# Patient Record
Sex: Female | Born: 1965 | Race: White | Hispanic: No | Marital: Married | Smoking: Former smoker
Health system: Southern US, Community
[De-identification: ages and names within clinical notes are randomized; demographics above are authoritative.]

## PROBLEM LIST (undated history)

## (undated) DIAGNOSIS — T7840XA Allergy, unspecified, initial encounter: Secondary | ICD-10-CM

## (undated) DIAGNOSIS — F329 Major depressive disorder, single episode, unspecified: Secondary | ICD-10-CM

## (undated) DIAGNOSIS — G43909 Migraine, unspecified, not intractable, without status migrainosus: Secondary | ICD-10-CM

## (undated) DIAGNOSIS — F419 Anxiety disorder, unspecified: Secondary | ICD-10-CM

## (undated) DIAGNOSIS — F32A Depression, unspecified: Secondary | ICD-10-CM

## (undated) HISTORY — DX: Major depressive disorder, single episode, unspecified: F32.9

## (undated) HISTORY — DX: Anxiety disorder, unspecified: F41.9

## (undated) HISTORY — DX: Depression, unspecified: F32.A

## (undated) HISTORY — PX: TUBAL LIGATION: SHX77

## (undated) HISTORY — DX: Migraine, unspecified, not intractable, without status migrainosus: G43.909

## (undated) HISTORY — DX: Allergy, unspecified, initial encounter: T78.40XA

---

## 2006-12-31 ENCOUNTER — Ambulatory Visit: Payer: Self-pay | Admitting: Family Medicine

## 2010-02-10 ENCOUNTER — Ambulatory Visit: Payer: Self-pay | Admitting: Family Medicine

## 2011-03-16 ENCOUNTER — Ambulatory Visit: Payer: Self-pay | Admitting: Family Medicine

## 2011-05-19 ENCOUNTER — Ambulatory Visit: Payer: Self-pay

## 2013-01-21 ENCOUNTER — Ambulatory Visit: Payer: Self-pay

## 2013-01-21 LAB — HM MAMMOGRAPHY

## 2013-10-28 LAB — HM PAP SMEAR

## 2015-01-18 ENCOUNTER — Telehealth: Payer: Self-pay | Admitting: Unknown Physician Specialty

## 2015-01-18 DIAGNOSIS — Z0000 Encounter for general adult medical examination without abnormal findings: Secondary | ICD-10-CM

## 2015-01-18 NOTE — Telephone Encounter (Signed)
Pt called stated she needs an order faxed to the lab at her job as they do her labs for free. Pt has a physical on 01/26/15. Please fax order [phone]. Thanks.

## 2015-01-18 NOTE — Telephone Encounter (Signed)
They are in as future orders

## 2015-01-18 NOTE — Telephone Encounter (Signed)
Routing to provider. Can you out orders in? Then I can print the orders and fax them.

## 2015-01-19 ENCOUNTER — Other Ambulatory Visit: Payer: Self-pay

## 2015-01-19 DIAGNOSIS — Z0000 Encounter for general adult medical examination without abnormal findings: Secondary | ICD-10-CM

## 2015-01-19 NOTE — Telephone Encounter (Signed)
Orders were released and faxed to the number the patient provided.

## 2015-01-25 DIAGNOSIS — F329 Major depressive disorder, single episode, unspecified: Secondary | ICD-10-CM

## 2015-01-25 DIAGNOSIS — G43009 Migraine without aura, not intractable, without status migrainosus: Secondary | ICD-10-CM | POA: Insufficient documentation

## 2015-01-25 DIAGNOSIS — G43909 Migraine, unspecified, not intractable, without status migrainosus: Secondary | ICD-10-CM

## 2015-01-25 DIAGNOSIS — F419 Anxiety disorder, unspecified: Secondary | ICD-10-CM

## 2015-01-26 ENCOUNTER — Telehealth: Payer: Self-pay | Admitting: Gastroenterology

## 2015-01-26 ENCOUNTER — Encounter: Payer: Self-pay | Admitting: Unknown Physician Specialty

## 2015-01-26 ENCOUNTER — Ambulatory Visit (INDEPENDENT_AMBULATORY_CARE_PROVIDER_SITE_OTHER): Payer: PRIVATE HEALTH INSURANCE | Admitting: Unknown Physician Specialty

## 2015-01-26 ENCOUNTER — Other Ambulatory Visit: Payer: Self-pay

## 2015-01-26 VITALS — BP 109/72 | HR 68 | Temp 98.0°F | Ht 63.6 in | Wt 147.8 lb

## 2015-01-26 DIAGNOSIS — Z0000 Encounter for general adult medical examination without abnormal findings: Secondary | ICD-10-CM

## 2015-01-26 NOTE — Progress Notes (Signed)
   BP 109/72 mmHg  Pulse 68  Temp(Src) 98 F (36.7 C)  Ht 5' 3.6" (1.615 m)  Wt 147 lb 12.8 oz (67.042 kg)  BMI 25.70 kg/m2  SpO2 98%  LMP 01/15/2015 (Exact Date)   Subjective:    Patient ID: Judy Blankenship, female    DOB: 12/11/1965, 49 y.o.   MRN: [id number]  HPI: Judy Blankenship is a 49 y.o. female  Chief Complaint  Patient presents with  . Annual Exam    pt's last pap was 10/28/13    Relevant past medical, surgical, family and social history reviewed and updated as indicated. Interim medical history since our last visit reviewed. Allergies and medications reviewed and updated.  Review of Systems  Constitutional: Negative.   HENT: Negative.   Eyes: Negative.   Respiratory: Negative.   Cardiovascular: Negative.   Gastrointestinal: Negative.   Endocrine: Negative.   Genitourinary: Negative.        Intermittent menses.    Musculoskeletal: Negative.   Skin: Negative.   Allergic/Immunologic: Negative.   Neurological: Negative.        Headaches are better  Hematological: Negative.   Psychiatric/Behavioral: Negative.   All other systems reviewed and are negative.   Per HPI unless specifically indicated above     Objective:    BP 109/72 mmHg  Pulse 68  Temp(Src) 98 F (36.7 C)  Ht 5' 3.6" (1.615 m)  Wt 147 lb 12.8 oz (67.042 kg)  BMI 25.70 kg/m2  SpO2 98%  LMP 01/15/2015 (Exact Date)  Wt Readings from Last 3 Encounters:  01/26/15 147 lb 12.8 oz (67.042 kg)  05/05/14 140 lb (63.504 kg)    Physical Exam  Constitutional: She is oriented to person, place, and time. She appears well-developed and well-nourished.  HENT:  Head: Normocephalic and atraumatic.  Eyes: Pupils are equal, round, and reactive to light. Right eye exhibits no discharge. Left eye exhibits no discharge. No scleral icterus.  Neck: Normal range of motion. Neck supple. Carotid bruit is not present. No thyromegaly present.  Cardiovascular: Normal rate, regular rhythm and normal heart sounds.  Exam  reveals no gallop and no friction rub.   No murmur heard. Pulmonary/Chest: Effort normal and breath sounds normal. No respiratory distress. She has no wheezes. She has no rales.  Abdominal: Soft. Bowel sounds are normal. There is no tenderness. There is no rebound.  Genitourinary: No breast swelling, tenderness or discharge.  Musculoskeletal: Normal range of motion.  Lymphadenopathy:    She has no cervical adenopathy.  Neurological: She is alert and oriented to person, place, and time.  Skin: Skin is warm, dry and intact. No rash noted.  Psychiatric: She has a normal mood and affect. Her speech is normal and behavior is normal. Judgment and thought content normal. Cognition and memory are normal.  Nursing note and vitals reviewed.      Assessment & Plan:   Problem List Items Addressed This Visit    None    Visit Diagnoses    Annual physical exam    -  Primary    Relevant Orders    Ambulatory referral to Gastroenterology    MM DIGITAL SCREENING BILATERAL       Awaiting labs she had done with the county Follow up plan: Return if symptoms worsen or fail to improve.

## 2015-01-26 NOTE — Telephone Encounter (Signed)
Gastroenterology Pre-Procedure Review  Request Date: 02-16-2015 Requesting Physician: Dr. Julian Hy  PATIENT REVIEW QUESTIONS: The patient responded to the following health history questions as indicated:    1. Are you having any GI issues? no 2. Do you have a personal history of Polyps? no 3. Do you have a family history of Colon Cancer or Polyps? yes (Polyp Father) 4. Diabetes Mellitus? no 5. Joint replacements in the past 12 months?no 6. Major health problems in the past 3 months?no 7. Any artificial heart valves, MVP, or defibrillator?no    MEDICATIONS & ALLERGIES:    Patient reports the following regarding taking any anticoagulation/antiplatelet therapy:   Plavix, Coumadin, Eliquis, Xarelto, Lovenox, Pradaxa, Brilinta, or Effient? no Aspirin? yes (Daily)  Patient confirms/reports the following medications:  Current Outpatient Prescriptions  Medication Sig Dispense Refill   eletriptan (RELPAX) 40 MG tablet Take 40 mg by mouth as needed for migraine or headache. May repeat in 2 hours if headache persists or recurs.     No current facility-administered medications for this visit.    Patient confirms/reports the following allergies:  NO No Known Allergies  No orders of the defined types were placed in this encounter.    AUTHORIZATION INFORMATION Primary Insurance: 1D#: Group #:  Secondary Insurance: 1D#: Group #:  SCHEDULE INFORMATION: Date: 02-16-2015 Time: Location:[facility]

## 2015-01-26 NOTE — Patient Instructions (Signed)
Think you're too busy to work out? We have the workout for you. In minutes, high-intensity interval training (H.I.I.T.) will have you sweating, breathing hard and maximizing the health benefits of exercise without the time commitment. Best of all, it's scientifically proven to work.  What Is H.I.I.T.? SHORT WORKOUTS 101 High-intensity interval training - referred to as H.I.I.T. - is based on the idea that short bursts of strenuous exercise can have a big impact on the body. If moderate exercise - like a 20-minute jog - is good for your heart, lungs and metabolism, H.I.I.T. packs the benefits of that workout and more into a few minutes. It may sound too good to be true, but learning this exercise technique and adapting it to your life can mean saving hours at the gym. If you think you don't have time to exercise, H.I.I.T. may be the workout for you.  You can try it with any aerobic activity you like. The principles of H.I.I.T. can be applied to running, biking, stair climbing, swimming, jumping rope, rowing, even hopping or skipping. (Yes, skipping!)  The downside? Even though H.I.I.T. lasts only minutes, the workouts are tough, requiring you to push your body near its limit.  HOW INTENSE IS HIGH INTENSITY? High-intensity exercise is obviously not a casual stroll down the street, but it's not a run-till-your-lungs-pop explosion, either. Think breathless, not winded. Heart-pounding, not exploding. Legs pumping, but not uncontrolled.  You don't need any fancy heart rate monitors to do these workouts. Use cues from your body as a guide. In the middle of a high-intensity workout you should be able to say single words, but not complete whole sentences. So, if you can keep chatting to your workout partner during this workout, pump it up a few notches.  03-17-29 Training This simple program will help you make the most of a short workout by improving heart health and endurance. Try it with your favorite  cardiovascular activity. The essentials of 03-17-29 training are simple. Run, ride or perhaps row on a rowing machine gently for 30 seconds, accelerate to a moderate pace for 20 seconds, then sprint as hard as you can for 10 seconds. (It should be called 30-20-10 training, obviously, but that is not as catchy.) Repeat.  You don't even need a stopwatch to monitor the 30-, 20-, and 10-second time changes. You can just count to yourself, which seems to make the intervals pass more quickly.  Best of all? The grueling, all-out portion of the workout lasts for only 10 seconds. C'mon, you can do anything for 10 seconds, right?  Got 10 Minutes? A solitary minute of hard work buried in 10 minutes of activity can make a big difference.  The 10-Minute Workout If you like to run, bike, row or swim - just a little bit - this workout is a great option for you. Step 1 Warm up for 2 minutes Step 2 Pedal, run or swim all-out for 20 seconds. Repeat 2 more times Warm down for 3 minutes    GET STARTED To benefit the most from really, really short workouts, you need to build the habit of doing them into your hectic life. Ideally, you'll complete the workout three times a week. The best way to build that habit is to start small and be willing to tweak your schedule where you can to accommodate your new workout.  First set up a spot in your house for your workout, equipped with whatever you need to get the job done: sneakers, a  chair, a towel, etc. Then slot your workout in before you would normally shower. (You can even do it in the bathroom.) Or wake up five minutes earlier and do it first thing in the morning, so you can head off to work feeling accomplished. Or do it during your lunch hour. Run up your office's stairs or grab a private conference room for just a few minutes. Or work it into your commute. If you walk or bike to work, add some heavy intervals on the way home.  GET A BOOST FROM MUSIC Creating a  workout playlist of high-energy tunes you love will not make your workout feel easier, but it may cause you to exercise harder without even realizing it. Best of all, if you are doing a really short workout, you need only one or two great tunes to get you through. If you are willing to try something a bit different, make your own music as you exercise. Sing, hum, clap your hands, whatever you can do to jam along to your playlist. It may give you an extra boost to finish strong.  Find a song or podcast that's the length of your really, really short workout. By the time the song is over, you're done.  Excerpted from the [employer] Well column http://www.nytimes.com/well/guides/really-really-short-workouts?smid=fb-nytwell&smtyp=pay  

## 2015-01-26 NOTE — Telephone Encounter (Signed)
Orders sent to [facility] for 02-16-15.

## 2015-02-02 ENCOUNTER — Telehealth: Payer: Self-pay | Admitting: Gastroenterology

## 2015-02-02 NOTE — Telephone Encounter (Signed)
No pre certification is required for CPT: [phone] with [employer]--Reference [phone].

## 2015-02-16 ENCOUNTER — Ambulatory Visit: Admit: 2015-02-16 | Payer: Self-pay | Admitting: Gastroenterology

## 2015-02-16 SURGERY — COLONOSCOPY WITH PROPOFOL
Anesthesia: General

## 2015-04-06 ENCOUNTER — Telehealth: Payer: Self-pay | Admitting: Gastroenterology

## 2015-04-06 NOTE — Telephone Encounter (Signed)
Patient left a voice message that she want to cancel her appointment for surgery this month.

## 2015-04-07 NOTE — Telephone Encounter (Signed)
Pt rescheduled to [facility] on 06/22/15. Order has been sent to [facility].

## 2015-06-22 LAB — HM COLONOSCOPY

## 2015-10-05 ENCOUNTER — Encounter: Payer: Self-pay | Admitting: Gastroenterology

## 2015-11-03 ENCOUNTER — Telehealth: Payer: Self-pay

## 2015-11-03 NOTE — Telephone Encounter (Signed)
Patient returned call and patient states she is not taking the medication that pharmacy was requesting. I let the patient know that I would tell the pharmacy this also.

## 2015-11-03 NOTE — Telephone Encounter (Signed)
We got a refill request for a medication that is not listed in patient's current med list. So I tried to call the patient and ask her about it but she did not answer and I left her a voicemail asking for her to please return my call.

## 2015-11-03 NOTE — Telephone Encounter (Signed)
Patient had called and left me a voicemail asking for me to call her at work so I did. She did not answer and I left her a voicemail asking for her to please return my call.

## 2016-07-25 ENCOUNTER — Encounter: Payer: Self-pay | Admitting: Unknown Physician Specialty

## 2016-07-25 ENCOUNTER — Ambulatory Visit (INDEPENDENT_AMBULATORY_CARE_PROVIDER_SITE_OTHER): Payer: Managed Care, Other (non HMO) | Admitting: Unknown Physician Specialty

## 2016-07-25 VITALS — BP 116/75 | HR 81 | Temp 98.4°F | Ht 63.0 in | Wt 149.3 lb

## 2016-07-25 DIAGNOSIS — Z0000 Encounter for general adult medical examination without abnormal findings: Secondary | ICD-10-CM

## 2016-07-25 NOTE — Progress Notes (Signed)
BP 116/75 (BP Location: Left Arm, Patient Position: Sitting, Cuff Size: Normal)   Pulse 81   Temp 98.4 F (36.9 C)   Ht 5\' 3"  (1.6 m)   Wt 149 lb 4.8 oz (67.7 kg)   LMP 06/12/2016 (Exact Date)   SpO2 98%   BMI 26.45 kg/m    Subjective:    Patient ID: Judy Blankenship, female    DOB: July 28, 1965, 51 y.o.   MRN: [id number]  HPI: Judy Blankenship is a 51 y.o. female  Chief Complaint  Patient presents with  . Annual Exam    pt states last mammogram was in 2016, states she would like to have one done this year  . Colonoscopy    pt states last colonoscopy was in 2016 and states that she was cleared for 5 years. Will try to find results for that.    Social History   Social History  . Marital status: Divorced    Spouse name: N/A  . Number of children: N/A  . Years of education: N/A   Occupational History  . Not on file.   Social History Main Topics  . Smoking status: Former Smoker    Quit date: 01/26/2007  . Smokeless tobacco: Never Used  . Alcohol use No  . Drug use: No  . Sexual activity: Yes   Other Topics Concern  . Not on file   Social History Narrative  . No narrative on file   Family History  Problem Relation Age of Onset  . Cancer Mother     ovarian  . Cancer Father     tongue  . Heart disease Maternal Grandfather     MI   Past Medical History:  Diagnosis Date  . Anxiety   . Depression   . Migraine    Past Surgical History:  Procedure Laterality Date  . CESAREAN SECTION    . TUBAL LIGATION       Relevant past medical, surgical, family and social history reviewed and updated as indicated. Interim medical history since our last visit reviewed.   Allergies and medications reviewed and updated.  Review of Systems  Constitutional: Negative.   HENT: Negative.   Eyes: Negative.   Respiratory: Negative.   Cardiovascular: Negative.   Gastrointestinal: Negative.   Endocrine: Negative.   Genitourinary:       Beginning menopausal symptoms and  skipped a period  Musculoskeletal: Negative.   Skin: Negative.   Allergic/Immunologic: Negative.   Neurological: Negative.   Hematological: Negative.   Psychiatric/Behavioral: Negative.     Per HPI unless specifically indicated above     Objective:    BP 116/75 (BP Location: Left Arm, Patient Position: Sitting, Cuff Size: Normal)   Pulse 81   Temp 98.4 F (36.9 C)   Ht 5\' 3"  (1.6 m)   Wt 149 lb 4.8 oz (67.7 kg)   LMP 06/12/2016 (Exact Date)   SpO2 98%   BMI 26.45 kg/m   Wt Readings from Last 3 Encounters:  07/25/16 149 lb 4.8 oz (67.7 kg)  01/26/15 147 lb 12.8 oz (67 kg)  05/05/14 140 lb (63.5 kg)    Physical Exam  Constitutional: She is oriented to person, place, and time. She appears well-developed and well-nourished.  HENT:  Head: Normocephalic and atraumatic.  Eyes: Pupils are equal, round, and reactive to light. Right eye exhibits no discharge. Left eye exhibits no discharge. No scleral icterus.  Neck: Normal range of motion. Neck supple. Carotid bruit is not present. No  thyromegaly present.  Cardiovascular: Normal rate, regular rhythm and normal heart sounds.  Exam reveals no gallop and no friction rub.   No murmur heard. Pulmonary/Chest: Effort normal and breath sounds normal. No respiratory distress. She has no wheezes. She has no rales.  Abdominal: Soft. Bowel sounds are normal. There is no tenderness. There is no rebound.  Genitourinary: No breast swelling, tenderness or discharge.  Musculoskeletal: Normal range of motion.  Lymphadenopathy:    She has no cervical adenopathy.  Neurological: She is alert and oriented to person, place, and time.  Skin: Skin is warm, dry and intact. No rash noted.  Psychiatric: She has a normal mood and affect. Her speech is normal and behavior is normal. Judgment and thought content normal. Cognition and memory are normal.       Assessment & Plan:   Problem List Items Addressed This Visit    None    Visit Diagnoses     Routine general medical examination at a health care facility    -  Primary   Relevant Orders   MM DIGITAL SCREENING BILATERAL   CBC with Differential/Platelet   Comprehensive metabolic panel   Lipid Panel w/o Chol/HDL Ratio   TSH       Follow up plan: Return in about 1 year (around 07/25/2017).

## 2016-07-25 NOTE — Patient Instructions (Signed)
Please do call to schedule your mammogram; the number to schedule one at either [facility] or [facility] is [phone]   

## 2016-07-26 ENCOUNTER — Encounter: Payer: Self-pay | Admitting: Unknown Physician Specialty

## 2016-07-26 LAB — LIPID PANEL W/O CHOL/HDL RATIO
CHOLESTEROL TOTAL: 218 mg/dL — AB (ref 100–199)
HDL: 49 mg/dL (ref >39)
LDL CALC: 147 mg/dL — AB (ref 0–99)
TRIGLYCERIDES: 111 mg/dL (ref 0–149)
VLDL CHOLESTEROL CAL: 22 mg/dL (ref 5–40)

## 2016-07-26 LAB — COMPREHENSIVE METABOLIC PANEL
ALK PHOS: 51 IU/L (ref 39–117)
ALT: 21 IU/L (ref 0–32)
AST: 19 IU/L (ref 0–40)
Albumin/Globulin Ratio: 1.8 (ref 1.2–2.2)
Albumin: 4.4 g/dL (ref 3.5–5.5)
BUN/Creatinine Ratio: 17 (ref 9–23)
BUN: 12 mg/dL (ref 6–24)
Bilirubin Total: 0.4 mg/dL (ref 0.0–1.2)
CO2: 29 mmol/L (ref 18–29)
CREATININE: 0.69 mg/dL (ref 0.57–1.00)
Calcium: 9.6 mg/dL (ref 8.7–10.2)
Chloride: 100 mmol/L (ref 96–106)
GFR calc Af Amer: 117 mL/min/{1.73_m2} (ref >59)
GFR calc non Af Amer: 101 mL/min/{1.73_m2} (ref >59)
GLUCOSE: 92 mg/dL (ref 65–99)
Globulin, Total: 2.5 g/dL (ref 1.5–4.5)
Potassium: 4.8 mmol/L (ref 3.5–5.2)
Sodium: 141 mmol/L (ref 134–144)
Total Protein: 6.9 g/dL (ref 6.0–8.5)

## 2016-07-26 LAB — CBC WITH DIFFERENTIAL/PLATELET
BASOS ABS: 0 10*3/uL (ref 0.0–0.2)
Basos: 0 %
EOS (ABSOLUTE): 0.2 10*3/uL (ref 0.0–0.4)
Eos: 3 %
HEMOGLOBIN: 12.5 g/dL (ref 11.1–15.9)
Hematocrit: 37.8 % (ref 34.0–46.6)
IMMATURE GRANULOCYTES: 0 %
Immature Grans (Abs): 0 10*3/uL (ref 0.0–0.1)
LYMPHS ABS: 2.3 10*3/uL (ref 0.7–3.1)
Lymphs: 31 %
MCH: 29.9 pg (ref 26.6–33.0)
MCHC: 33.1 g/dL (ref 31.5–35.7)
MCV: 90 fL (ref 79–97)
MONOCYTES: 11 %
MONOS ABS: 0.8 10*3/uL (ref 0.1–0.9)
NEUTROS PCT: 55 %
Neutrophils Absolute: 4 10*3/uL (ref 1.4–7.0)
Platelets: 293 10*3/uL (ref 150–379)
RBC: 4.18 x10E6/uL (ref 3.77–5.28)
RDW: 12.9 % (ref 12.3–15.4)
WBC: 7.4 10*3/uL (ref 3.4–10.8)

## 2016-07-26 LAB — TSH: TSH: 1.42 u[IU]/mL (ref 0.450–4.500)

## 2016-07-28 ENCOUNTER — Encounter: Payer: Self-pay | Admitting: Radiology

## 2016-07-28 ENCOUNTER — Ambulatory Visit
Admission: RE | Admit: 2016-07-28 | Discharge: 2016-07-28 | Disposition: A | Discharge status: Home / Self Care | Payer: Managed Care, Other (non HMO) | Source: Ambulatory Visit | Attending: Unknown Physician Specialty | Admitting: Unknown Physician Specialty

## 2016-07-28 DIAGNOSIS — Z1231 Encounter for screening mammogram for malignant neoplasm of breast: Secondary | ICD-10-CM | POA: Diagnosis present

## 2017-03-12 ENCOUNTER — Ambulatory Visit: Payer: Self-pay | Admitting: Physician Assistant

## 2017-03-12 ENCOUNTER — Encounter: Payer: Self-pay | Admitting: Physician Assistant

## 2017-03-12 VITALS — BP 119/80 | HR 84 | Temp 98.5°F | Resp 16

## 2017-03-12 DIAGNOSIS — J069 Acute upper respiratory infection, unspecified: Secondary | ICD-10-CM

## 2017-03-12 MED ORDER — FLUTICASONE PROPIONATE 50 MCG/ACT NA SUSP: 2.0000 | Freq: Every day | NASAL | 6 refills | Status: DC

## 2017-03-12 MED ORDER — AZITHROMYCIN 250 MG PO TABS: ORAL_TABLET | ORAL | 0 refills | Status: DC

## 2017-03-12 NOTE — Progress Notes (Signed)
S: C/o runny nose and congestion for 7 days, no fever, chills, cp/sob, v/d; mucus is clear throughout the day, has a lot of sinus pressure;  cough is sporadic, back feels tight like she is wheezing, her mother and grandchild are both sick, was with them all of last week  Using otc meds: sudafed  O: PE: vitals wnl, nad, perrl eomi, normocephalic, tms dull, nasal mucosa red and swollen, throat injected, neck supple no lymph, lungs c t a, cv rrr, neuro intact  A:  Acute uri   P: drink fluids, continue regular meds , use otc meds of choice, return if not improving in 5 days, return earlier if worsening , zpack, flonase

## 2017-06-22 ENCOUNTER — Encounter: Payer: Self-pay | Admitting: Family Medicine

## 2017-06-22 ENCOUNTER — Ambulatory Visit (INDEPENDENT_AMBULATORY_CARE_PROVIDER_SITE_OTHER): Payer: Managed Care, Other (non HMO) | Admitting: Family Medicine

## 2017-06-22 VITALS — BP 138/88 | HR 90 | Temp 97.7°F | Ht 63.05 in | Wt 149.0 lb

## 2017-06-22 DIAGNOSIS — N930 Postcoital and contact bleeding: Secondary | ICD-10-CM | POA: Diagnosis not present

## 2017-06-22 DIAGNOSIS — R635 Abnormal weight gain: Secondary | ICD-10-CM

## 2017-06-22 DIAGNOSIS — G43009 Migraine without aura, not intractable, without status migrainosus: Secondary | ICD-10-CM

## 2017-06-22 DIAGNOSIS — R102 Pelvic and perineal pain: Secondary | ICD-10-CM

## 2017-06-22 DIAGNOSIS — N926 Irregular menstruation, unspecified: Secondary | ICD-10-CM | POA: Diagnosis not present

## 2017-06-22 MED ORDER — ELETRIPTAN HYDROBROMIDE 40 MG PO TABS: 40.0000 mg | ORAL_TABLET | ORAL | 5 refills | Status: DC | PRN

## 2017-06-22 NOTE — Progress Notes (Signed)
BP 138/88 (BP Location: Left Arm, Patient Position: Sitting, Cuff Size: Normal)   Pulse 90   Temp 97.7 F (36.5 C) (Oral)   Ht 5' 3.05" (1.601 m)   Wt 149 lb (67.6 kg)   LMP 06/08/2017   SpO2 98%   BMI 26.35 kg/m    Subjective:    Patient ID: Judy Blankenship, female    DOB: 09/05/1965, 52 y.o.   MRN: [id number]  HPI: Judy Blankenship is a 52 y.o. female  Chief Complaint  Patient presents with  . Establish Care  . Menopause    Change in mood and cycles, menstrusal migraines getting worse     HPI Patient is here to establish care Started to have some menopausal symptoms Migraines have increased dramatically in the last 4-5 months Used to have 3 day periods, but now 7 days and gushing Aches and anxiety Menstrual migraines had actually decreased over the last few years, but in the last two months, had to use 4-5 relpax She is having terrible bloating Periods have worsened, stabbing pain in her pelvis Some breast tenderness, like getting ready to start her period Had a full period Jan 11th for a week, then bad migraine on the 16th, then finished and then started bleeding again Missing work Having post-coital bleeding Weight gain over last 10 years Bowels are constipated Skin is dry Hx been anemic; low iron Other triggers for migraines; smells can affect her during migraine, as well as light and sound No aura Mother used to have migraines years ago Otherwise, she is healthy, not a medicine person, not sickly  Depression screen [facility] 2/9 06/22/2017 07/25/2016 01/26/2015  Decreased Interest 0 0 0  Down, Depressed, Hopeless 0 0 0  PHQ - 2 Score 0 0 0   Relevant past medical, surgical, family and social history reviewed Past Medical History:  Diagnosis Date  . Anxiety   . Depression   . Migraine    Past Surgical History:  Procedure Laterality Date  . CESAREAN SECTION    . TUBAL LIGATION     Family History  Problem Relation Age of Onset  . Cancer Mother        ovarian    . Cancer Father        tongue  . Heart disease Father        massive heart attack  . Heart disease Maternal Grandfather        MI  . Aneurysm Paternal Grandmother        brain  . Breast cancer Maternal Aunt    Social History   Tobacco Use  . Smoking status: Former Smoker    Last attempt to quit: 01/26/2007    Years since quitting: 10.4  . Smokeless tobacco: Never Used  Substance Use Topics  . Alcohol use: No    Alcohol/week: 0.0 oz  . Drug use: No   Interim medical history since last visit reviewed. Allergies and medications reviewed  Review of Systems Per HPI unless specifically indicated above     Objective:    BP 138/88 (BP Location: Left Arm, Patient Position: Sitting, Cuff Size: Normal)   Pulse 90   Temp 97.7 F (36.5 C) (Oral)   Ht 5' 3.05" (1.601 m)   Wt 149 lb (67.6 kg)   LMP 06/08/2017   SpO2 98%   BMI 26.35 kg/m   Wt Readings from Last 3 Encounters:  06/22/17 149 lb (67.6 kg)  07/25/16 149 lb 4.8 oz (67.7 kg)  01/26/15 147 lb 12.8 oz (67 kg)    Physical Exam  Constitutional: She appears well-developed and well-nourished.  HENT:  Mouth/Throat: Mucous membranes are normal.  Eyes: EOM are normal. No scleral icterus.  Cardiovascular: Normal rate and regular rhythm.  Pulmonary/Chest: Effort normal and breath sounds normal.  Abdominal: Soft. Normal appearance and bowel sounds are normal. She exhibits no distension. There is no tenderness.  Genitourinary: There is no rash on the right labia. There is no rash on the left labia. Uterus is not enlarged and not tender. Cervix exhibits no motion tenderness, no discharge and no friability. Right adnexum displays no mass, no tenderness and no fullness. Left adnexum displays no mass, no tenderness and no fullness. No erythema or bleeding in the vagina. No vaginal discharge found.  Psychiatric: She has a normal mood and affect. Her behavior is normal.    Results for orders placed or performed in visit on 07/25/16   HM COLONOSCOPY  Result Value Ref Range   HM Colonoscopy See Report (in chart) See Report (in chart), Patient Reported      Assessment & Plan:   Problem List Items Addressed This Visit      Cardiovascular and Mediastinum   Migraine without aura    Trial of magnesium for prevention; other preventive meds in future if needed; relpax refills      Relevant Medications   eletriptan (RELPAX) 40 MG tablet    Other Visit Diagnoses    Irregular menstrual cycle    -  Primary   will check thyroid, CBC, LH, FSH, pelvic US; consider referral to GYN   Relevant Orders   US Transvaginal Non-OB   US Pelvis Complete   TSH   T4, free   LH   FSH   Weight gain       check thyroid function, which may also throw off menstrual cycle   Relevant Orders   TSH   T4, free   Postcoital bleeding       concerning; will get pelvic US and refer to GYN for EMB after results   Relevant Orders   US Transvaginal Non-OB   US Pelvis Complete   CBC with Differential/Platelet   Pelvic pain       labs and pelvic US; to GYN if not etiology   Relevant Orders   US Transvaginal Non-OB   US Pelvis Complete       Follow up plan: Return for complete physical when due.  An after-visit summary was printed and given to the patient at [facility].  Please see the patient instructions which may contain other information and recommendations beyond what is mentioned above in the assessment and plan.  Meds ordered this encounter  Medications  . eletriptan (RELPAX) 40 MG tablet    Sig: Take 1 tablet (40 mg total) by mouth as needed. At onset of migraine; may take 1 additional pill 2 hours later if needed; max 2 per 24 hours    Dispense:  10 tablet    Refill:  5    Orders Placed This Encounter  Procedures  . US Transvaginal Non-OB  . US Pelvis Complete  . CBC with Differential/Platelet  . TSH  . T4, free  . LH  . [place]

## 2017-06-22 NOTE — Assessment & Plan Note (Signed)
Trial of magnesium for prevention; other preventive meds in future if needed; relpax refills

## 2017-06-22 NOTE — Patient Instructions (Signed)
We'll get labs today We'll schedule an ultrasound If you have not heard anything from my staff in a week about any orders/referrals/studies from today, please contact us here to follow-up [phone] Consider magnesium oxide 250 mg daily

## 2017-06-23 LAB — CBC WITH DIFFERENTIAL/PLATELET
Basophils Absolute: 57 cells/uL (ref 0–200)
Basophils Relative: 0.7 %
EOS PCT: 2.2 %
Eosinophils Absolute: 178 cells/uL (ref 15–500)
HEMATOCRIT: 38.9 % (ref 35.0–45.0)
Hemoglobin: 13.3 g/dL (ref 11.7–15.5)
LYMPHS ABS: 2884 {cells}/uL (ref 850–3900)
MCH: 30.6 pg (ref 27.0–33.0)
MCHC: 34.2 g/dL (ref 32.0–36.0)
MCV: 89.4 fL (ref 80.0–100.0)
MPV: 10.2 fL (ref 7.5–12.5)
Monocytes Relative: 7.1 %
NEUTROS PCT: 54.4 %
Neutro Abs: 4406 cells/uL (ref 1500–7800)
PLATELETS: 354 10*3/uL (ref 140–400)
RBC: 4.35 10*6/uL (ref 3.80–5.10)
RDW: 11.6 % (ref 11.0–15.0)
Total Lymphocyte: 35.6 %
WBC mixed population: 575 cells/uL (ref 200–950)
WBC: 8.1 10*3/uL (ref 3.8–10.8)

## 2017-06-23 LAB — FOLLICLE STIMULATING HORMONE: FSH: 51.3 m[IU]/mL

## 2017-06-23 LAB — TSH: TSH: 1.05 mIU/L

## 2017-06-23 LAB — LUTEINIZING HORMONE: LH: 34.9 m[IU]/mL

## 2017-06-23 LAB — T4, FREE: Free T4: 1.4 ng/dL (ref 0.8–1.8)

## 2017-06-26 ENCOUNTER — Telehealth: Payer: Self-pay

## 2017-06-26 DIAGNOSIS — N924 Excessive bleeding in the premenopausal period: Secondary | ICD-10-CM

## 2017-06-26 NOTE — Telephone Encounter (Signed)
Called pt informed her of the information below, pt gave verbal understanding. Referral placed.

## 2017-06-26 NOTE — Telephone Encounter (Signed)
-----   Message from Arnetha Courser, MD sent at 06/25/2017  2:02 PM EST ----- Please let pt know that she is not anemic; she does appear to be heading into menopause; we'll see what the ultrasound shows; I'm going to suggest referral to a GYN to help [place] manage her bleeding and symptoms; if she agrees, please enter referral to GYN of choice; thank you

## 2017-06-28 ENCOUNTER — Telehealth: Payer: Self-pay | Admitting: Family Medicine

## 2017-06-28 ENCOUNTER — Ambulatory Visit
Admission: RE | Admit: 2017-06-28 | Discharge: 2017-06-28 | Disposition: A | Discharge status: Home / Self Care | Payer: Managed Care, Other (non HMO) | Source: Ambulatory Visit | Attending: Family Medicine | Admitting: Family Medicine

## 2017-06-28 DIAGNOSIS — N930 Postcoital and contact bleeding: Secondary | ICD-10-CM | POA: Insufficient documentation

## 2017-06-28 DIAGNOSIS — R102 Pelvic and perineal pain: Secondary | ICD-10-CM | POA: Diagnosis present

## 2017-06-28 DIAGNOSIS — N926 Irregular menstruation, unspecified: Secondary | ICD-10-CM | POA: Insufficient documentation

## 2017-06-28 DIAGNOSIS — D259 Leiomyoma of uterus, unspecified: Secondary | ICD-10-CM | POA: Diagnosis not present

## 2017-06-28 NOTE — Telephone Encounter (Signed)
Copied from [place] [id number]. Topic: Inquiry >> Jun 28, 2017 10:51 AM [name] I, NT wrote: Pt Called and wanted Doctor Lada Nurse to call her,Thanks

## 2017-06-28 NOTE — Telephone Encounter (Signed)
Referral has been sent to the requested provider.

## 2017-06-28 NOTE — Telephone Encounter (Signed)
Copied from [place]. Topic: Referral - Question >> Jun 28, 2017 10:58 AM Vernona Rieger wrote: Patient wants her obgyn referral to go to [place] at [facility] [phone] ( her appt is feb 27th )

## 2017-07-02 ENCOUNTER — Telehealth: Payer: Self-pay

## 2017-07-02 NOTE — Telephone Encounter (Signed)
Called pt no answer. LM for pt informing her of the information below. Advised pt to call back for questions or concerns. Also mailed pt education from [employer] regarding fibroids.

## 2017-07-02 NOTE — Telephone Encounter (Signed)
-----   Message from Arnetha Courser, MD sent at 07/02/2017  9:45 AM EST ----- Please let the patient know that her ultrasound showed two fibroids; the largest is on the left side, about 1.5 inches across, and the other is on the right about half of an inch in size; the good news is that there is no abnormal endometrial thickness; we'll have her see the gynecologist about the fibroids and her other symptoms; thank you

## 2017-07-06 ENCOUNTER — Ambulatory Visit: Payer: Self-pay | Admitting: Physician Assistant

## 2017-07-06 ENCOUNTER — Encounter: Payer: Self-pay | Admitting: Physician Assistant

## 2017-07-06 VITALS — BP 110/70 | HR 82 | Temp 97.7°F | Resp 16

## 2017-07-06 DIAGNOSIS — J0120 Acute ethmoidal sinusitis, unspecified: Secondary | ICD-10-CM

## 2017-07-06 MED ORDER — AMOXICILLIN 875 MG PO TABS: 875.0000 mg | ORAL_TABLET | Freq: Two times a day (BID) | ORAL | 0 refills | Status: DC

## 2017-07-06 MED ORDER — FLUCONAZOLE 150 MG PO TABS: 150.0000 mg | ORAL_TABLET | Freq: Once | ORAL | 0 refills | Status: AC

## 2017-07-06 MED ORDER — BENZONATATE 200 MG PO CAPS
200.0000 mg | ORAL_CAPSULE | Freq: Two times a day (BID) | ORAL | 0 refills | Status: DC | PRN
Start: 2017-07-06 — End: 2017-11-16

## 2017-07-06 MED ORDER — FEXOFENADINE-PSEUDOEPHED ER 60-120 MG PO TB12: 1.0000 | ORAL_TABLET | Freq: Two times a day (BID) | ORAL | 0 refills | Status: DC

## 2017-07-06 NOTE — Progress Notes (Signed)
   Subjective: Sinus congestion    Patient ID: Judy Blankenship, female    DOB: 12-04-1965, 52 y.o.   MRN: [id number]  HPI Patient presents with 1 week of increasing sinus pressure and thick rhinorrhea.  Patient also complained of cough secondary to postnasal drainage.  Patient has a frontal headache.  Patient stated nausea with no vomiting.  Patient denies diarrhea.  Patient also state he has a sore throat secondary to postnasal drainage.  Patient states no relief over-the-counter Sudafed.   Review of Systems Unremarkable except for complaint    Objective:   Physical Exam HEENT remarkable for bilateral maxillary guarding.  Edematous nasal turbinate thick rhinorrhea.  Mucous drainage tract post posterior pharynx.  Neck is supple for adenopathy.  Lungs are clear to auscultation heart regular rate and rhythm.       Assessment & Plan: Sinusitis.  Patient given discharge care instruction and prescription for amoxicillin, Allegra-D, Tessalon Perles, and Diflucan for yeast infection secondary to taking antibiotics.  Patient advised to follow-up PCP if no improvement in 1 week.

## 2017-07-27 ENCOUNTER — Encounter: Payer: Managed Care, Other (non HMO) | Admitting: Unknown Physician Specialty

## 2017-09-10 ENCOUNTER — Ambulatory Visit: Payer: Managed Care, Other (non HMO) | Admitting: Family Medicine

## 2017-09-10 ENCOUNTER — Encounter: Payer: Managed Care, Other (non HMO) | Admitting: Family Medicine

## 2017-10-24 ENCOUNTER — Other Ambulatory Visit: Payer: Self-pay | Admitting: Family Medicine

## 2017-10-24 DIAGNOSIS — Z1231 Encounter for screening mammogram for malignant neoplasm of breast: Secondary | ICD-10-CM

## 2017-11-16 ENCOUNTER — Encounter: Payer: Self-pay | Admitting: Family Medicine

## 2017-11-16 ENCOUNTER — Ambulatory Visit
Admission: RE | Admit: 2017-11-16 | Discharge: 2017-11-16 | Disposition: A | Discharge status: Home / Self Care | Payer: Managed Care, Other (non HMO) | Source: Ambulatory Visit | Attending: Family Medicine | Admitting: Family Medicine

## 2017-11-16 ENCOUNTER — Ambulatory Visit (INDEPENDENT_AMBULATORY_CARE_PROVIDER_SITE_OTHER): Payer: Managed Care, Other (non HMO) | Admitting: Family Medicine

## 2017-11-16 VITALS — BP 106/62 | HR 90 | Temp 98.1°F | Resp 12 | Ht 63.0 in | Wt 153.2 lb

## 2017-11-16 DIAGNOSIS — Z1231 Encounter for screening mammogram for malignant neoplasm of breast: Secondary | ICD-10-CM | POA: Diagnosis not present

## 2017-11-16 DIAGNOSIS — Z0000 Encounter for general adult medical examination without abnormal findings: Secondary | ICD-10-CM

## 2017-11-16 NOTE — Patient Instructions (Addendum)
Consider getting the new shingles vaccine called Shingrix; that is available for individuals 53 years of age and older, and is recommended even if you have had shingles in the past and/or already received the old shingles vaccine (Zostavax); it is a two-part series, and is available at many local pharmacies  Health Maintenance, Female Adopting a healthy lifestyle and getting preventive care can go a long way to promote health and wellness. Talk with your health care provider about what schedule of regular examinations is right for you. This is a good chance for you to check in with your provider about disease prevention and staying healthy. In between checkups, there are plenty of things you can do on your own. Experts have done a lot of research about which lifestyle changes and preventive measures are most likely to keep you healthy. Ask your health care provider for more information. Weight and diet Eat a healthy diet  Be sure to include plenty of vegetables, fruits, low-fat dairy products, and lean protein.  Do not eat a lot of foods high in solid fats, added sugars, or salt.  Get regular exercise. This is one of the most important things you can do for your health. ? Most adults should exercise for at least 150 minutes each week. The exercise should increase your heart rate and make you sweat (moderate-intensity exercise). ? Most adults should also do strengthening exercises at least twice a week. This is in addition to the moderate-intensity exercise.  Maintain a healthy weight  Body mass index (BMI) is a measurement that can be used to identify possible weight problems. It estimates body fat based on height and weight. Your health care provider can help determine your BMI and help you achieve or maintain a healthy weight.  For females 76 years of age and older: ? A BMI below 18.5 is considered underweight. ? A BMI of 18.5 to 24.9 is normal. ? A BMI of 25 to 29.9 is considered  overweight. ? A BMI of 30 and above is considered obese.  Watch levels of cholesterol and blood lipids  You should start having your blood tested for lipids and cholesterol at 53 years of age, then have this test every 5 years.  You may need to have your cholesterol levels checked more often if: ? Your lipid or cholesterol levels are high. ? You are older than 53 years of age. ? You are at high risk for heart disease.  Cancer screening Lung Cancer  Lung cancer screening is recommended for adults 11-67 years old who are at high risk for lung cancer because of a history of smoking.  A yearly low-dose CT scan of the lungs is recommended for people who: ? Currently smoke. ? Have quit within the past 15 years. ? Have at least a 30-pack-year history of smoking. A pack year is smoking an average of one pack of cigarettes a day for 1 year.  Yearly screening should continue until it has been 15 years since you quit.  Yearly screening should stop if you develop a health problem that would prevent you from having lung cancer treatment.  Breast Cancer  Practice breast self-awareness. This means understanding how your breasts normally appear and feel.  It also means doing regular breast self-exams. Let your health care provider know about any changes, no matter how small.  If you are in your 20s or 30s, you should have a clinical breast exam (CBE) by a health care provider every 1-3 years as part  of a regular health exam.  If you are 30 or older, have a CBE every year. Also consider having a breast X-ray (mammogram) every year.  If you have a family history of breast cancer, talk to your health care provider about genetic screening.  If you are at high risk for breast cancer, talk to your health care provider about having an MRI and a mammogram every year.  Breast cancer gene (BRCA) assessment is recommended for women who have family members with BRCA-related cancers. BRCA-related cancers  include: ? Breast. ? Ovarian. ? Tubal. ? Peritoneal cancers.  Results of the assessment will determine the need for genetic counseling and BRCA1 and BRCA2 testing.  Cervical Cancer Your health care provider may recommend that you be screened regularly for cancer of the pelvic organs (ovaries, uterus, and vagina). This screening involves a pelvic examination, including checking for microscopic changes to the surface of your cervix (Pap test). You may be encouraged to have this screening done every 3 years, beginning at age 70.  For women ages 17-65, health care providers may recommend pelvic exams and Pap testing every 3 years, or they may recommend the Pap and pelvic exam, combined with testing for human papilloma virus (HPV), every 5 years. Some types of HPV increase your risk of cervical cancer. Testing for HPV may also be done on women of any age with unclear Pap test results.  Other health care providers may not recommend any screening for nonpregnant women who are considered low risk for pelvic cancer and who do not have symptoms. Ask your health care provider if a screening pelvic exam is right for you.  If you have had past treatment for cervical cancer or a condition that could lead to cancer, you need Pap tests and screening for cancer for at least 20 years after your treatment. If Pap tests have been discontinued, your risk factors (such as having a new sexual partner) need to be reassessed to determine if screening should resume. Some women have medical problems that increase the chance of getting cervical cancer. In these cases, your health care provider may recommend more frequent screening and Pap tests.  Colorectal Cancer  This type of cancer can be detected and often prevented.  Routine colorectal cancer screening usually begins at 53 years of age and continues through 53 years of age.  Your health care provider may recommend screening at an earlier age if you have risk factors  for colon cancer.  Your health care provider may also recommend using home test kits to check for hidden blood in the stool.  A small camera at the end of a tube can be used to examine your colon directly (sigmoidoscopy or colonoscopy). This is done to check for the earliest forms of colorectal cancer.  Routine screening usually begins at age 85.  Direct examination of the colon should be repeated every 5-10 years through 53 years of age. However, you may need to be screened more often if early forms of precancerous polyps or small growths are found.  Skin Cancer  Check your skin from head to toe regularly.  Tell your health care provider about any new moles or changes in moles, especially if there is a change in a mole's shape or color.  Also tell your health care provider if you have a mole that is larger than the size of a pencil eraser.  Always use sunscreen. Apply sunscreen liberally and repeatedly throughout the day.  Protect yourself by wearing long  sleeves, pants, a wide-brimmed hat, and sunglasses whenever you are outside.  Heart disease, diabetes, and high blood pressure  High blood pressure causes heart disease and increases the risk of stroke. High blood pressure is more likely to develop in: ? People who have blood pressure in the high end of the normal range (130-139/85-89 mm Hg). ? People who are overweight or obese. ? People who are African American.  If you are 93-79 years of age, have your blood pressure checked every 3-5 years. If you are 85 years of age or older, have your blood pressure checked every year. You should have your blood pressure measured twice-once when you are at a hospital or clinic, and once when you are not at a hospital or clinic. Record the average of the two measurements. To check your blood pressure when you are not at a hospital or clinic, you can use: ? An automated blood pressure machine at a pharmacy. ? A home blood pressure monitor.  If  you are between 74 years and 26 years old, ask your health care provider if you should take aspirin to prevent strokes.  Have regular diabetes screenings. This involves taking a blood sample to check your fasting blood sugar level. ? If you are at a normal weight and have a low risk for diabetes, have this test once every three years after 53 years of age. ? If you are overweight and have a high risk for diabetes, consider being tested at a younger age or more often. Preventing infection Hepatitis B  If you have a higher risk for hepatitis B, you should be screened for this virus. You are considered at high risk for hepatitis B if: ? You were born in a country where hepatitis B is common. Ask your health care provider which countries are considered high risk. ? Your parents were born in a high-risk country, and you have not been immunized against hepatitis B (hepatitis B vaccine). ? You have HIV or AIDS. ? You use needles to inject street drugs. ? You live with someone who has hepatitis B. ? You have had sex with someone who has hepatitis B. ? You get hemodialysis treatment. ? You take certain medicines for conditions, including cancer, organ transplantation, and autoimmune conditions.  Hepatitis C  Blood testing is recommended for: ? Everyone born from [birth year] through 1966. ? Anyone with known risk factors for hepatitis C.  Sexually transmitted infections (STIs)  You should be screened for sexually transmitted infections (STIs) including gonorrhea and chlamydia if: ? You are sexually active and are younger than 53 years of age. ? You are older than 53 years of age and your health care provider tells you that you are at risk for this type of infection. ? Your sexual activity has changed since you were last screened and you are at an increased risk for chlamydia or gonorrhea. Ask your health care provider if you are at risk.  If you do not have HIV, but are at risk, it may be recommended  that you take a prescription medicine daily to prevent HIV infection. This is called pre-exposure prophylaxis (PrEP). You are considered at risk if: ? You are sexually active and do not regularly use condoms or know the HIV status of your partner(s). ? You take drugs by injection. ? You are sexually active with a partner who has HIV.  Talk with your health care provider about whether you are at high risk of being infected with HIV. If  you choose to begin PrEP, you should first be tested for HIV. You should then be tested every 3 months for as long as you are taking PrEP. Pregnancy  If you are premenopausal and you may become pregnant, ask your health care provider about preconception counseling.  If you may become pregnant, take 400 to 800 micrograms (mcg) of folic acid every day.  If you want to prevent pregnancy, talk to your health care provider about birth control (contraception). Osteoporosis and menopause  Osteoporosis is a disease in which the bones lose minerals and strength with aging. This can result in serious bone fractures. Your risk for osteoporosis can be identified using a bone density scan.  If you are 75 years of age or older, or if you are at risk for osteoporosis and fractures, ask your health care provider if you should be screened.  Ask your health care provider whether you should take a calcium or vitamin D supplement to lower your risk for osteoporosis.  Menopause may have certain physical symptoms and risks.  Hormone replacement therapy may reduce some of these symptoms and risks. Talk to your health care provider about whether hormone replacement therapy is right for you. Follow these instructions at home:  Schedule regular health, dental, and eye exams.  Stay current with your immunizations.  Do not use any tobacco products including cigarettes, chewing tobacco, or electronic cigarettes.  If you are pregnant, do not drink alcohol.  If you are  breastfeeding, limit how much and how often you drink alcohol.  Limit alcohol intake to no more than 1 drink per day for nonpregnant women. One drink equals 12 ounces of beer, 5 ounces of wine, or 1 ounces of hard liquor.  Do not use street drugs.  Do not share needles.  Ask your health care provider for help if you need support or information about quitting drugs.  Tell your health care provider if you often feel depressed.  Tell your health care provider if you have ever been abused or do not feel safe at home. This information is not intended to replace advice given to you by your health care provider. Make sure you discuss any questions you have with your health care provider. Document Released: 11/28/2010 Document Revised: 10/21/2015 Document Reviewed: 02/16/2015 Elsevier Interactive Patient Education  2018 [place] out the information at [employer] entitled "Nutrition for Weight Loss: What You Need to Know about Fad Diets" Try to lose between 1-2 pounds per week by taking in fewer calories and burning off more calories You can succeed by limiting portions, limiting foods dense in calories and fat, becoming more active, and drinking 8 glasses of water a day (64 ounces) Don't skip meals, especially breakfast, as skipping meals may alter your metabolism Do not use over-the-counter weight loss pills or gimmicks that claim rapid weight loss A healthy BMI (or body mass index) is between 18.5 and 24.9 You can calculate your ideal BMI at the [place] website [employer]  If you have not heard back as to whether or not your pap smear is up-to-date in one week, please call us

## 2017-11-16 NOTE — Progress Notes (Signed)
Patient ID: Judy Blankenship, female   DOB: Dec 10, 1965, 52 y.o.   MRN: [id number]   Subjective:   Judy Blankenship is a 52 y.o. female here for a complete physical exam  Interim issues since last visit: saw the GYN; tried OCP and that did NOT work; now on the estrogen patch twice a week and progesterone pills every day; that is working now; this would be the time of the month that she would be having symptoms and she is having NO symptoms  USPSTF grade A and B recommendations Depression:  Depression screen [facility] 2/9 11/16/2017 06/22/2017 07/25/2016 01/26/2015  Decreased Interest 0 0 0 0  Down, Depressed, Hopeless 0 0 0 0  PHQ - 2 Score 0 0 0 0  Altered sleeping 0 - - -  Tired, decreased energy 0 - - -  Change in appetite 0 - - -  Feeling bad or failure about yourself  0 - - -  Trouble concentrating 0 - - -  Moving slowly or fidgety/restless 0 - - -  Suicidal thoughts 0 - - -  PHQ-9 Score 0 - - -  Difficult doing work/chores Not difficult at all - - -   Hypertension: BP Readings from Last 3 Encounters:  11/16/17 106/62  07/06/17 110/70  06/22/17 138/88   Obesity: Wt Readings from Last 3 Encounters:  11/16/17 153 lb 3.2 oz (69.5 kg)  06/22/17 149 lb (67.6 kg)  07/25/16 149 lb 4.8 oz (67.7 kg)   BMI Readings from Last 3 Encounters:  11/16/17 27.14 kg/m  06/22/17 26.35 kg/m  07/25/16 26.45 kg/m    Skin cancer: nothing worrisome Lung cancer:  nonsmoker Breast cancer: mammogram was done this morning Colorectal cancer:  Cervical cancer screening: pap smear done she thinks at GYN, but we will see if truly done or just EMB BRCA gene screening: family hx of breast and/or ovarian cancer and/or metastatic prostate cancer? Mother had uterine cancer and maternal aunts x 2 and grandmother; not obese HIV, hep B, hep C: HIV declined, not in birth cohort for hep C STD testing and prevention (chl/gon/syphilis): not interested Intimate partner violence: no abuse Contraception: tried OCPs; no  worries about pregnancy she says Osteoporosis: never took steroids Fall prevention/vitamin D: discussed; calcium intake discussed, food best, supplement only if needed Immunizations: discussed shingrix Diet: room for improvement for fruits and veggies; not much fatty meats Exercise: nothing regular; usually walks but not as much, feels changes in her body and just exhausted; she'll try to walk more Alcohol: no   [facility] Visit from 11/16/2017 in [facility]  AUDIT-C Score  0     Tobacco use: nonsmoker AAA: n/a Aspirin: will check calculator after today's labs Glucose: today (nonfasting) Glucose  Date Value Ref Range Status  07/25/2016 92 65 - 99 mg/dL Final   Glucose, Bld  Date Value Ref Range Status  11/16/2017 91 65 - 139 mg/dL Final    Comment:    .        Non-fasting reference interval .    Lipids:  Lab Results  Component Value Date   CHOL 214 (H) 11/16/2017   CHOL 218 (H) 07/25/2016   Lab Results  Component Value Date   HDL 47 (L) 11/16/2017   HDL 49 07/25/2016   Lab Results  Component Value Date   LDLCALC 145 (H) 11/16/2017   LDLCALC 147 (H) 07/25/2016   Lab Results  Component Value Date   TRIG 103 11/16/2017   TRIG 111  07/25/2016   Lab Results  Component Value Date   CHOLHDL 4.6 11/16/2017   No results found for: LDLDIRECT   Past Medical History:  Diagnosis Date  . Anxiety   . Depression   . Migraine    Past Surgical History:  Procedure Laterality Date  . CESAREAN SECTION    . TUBAL LIGATION     Family History  Problem Relation Age of Onset  . Cancer Mother        ovarian  . Cancer Father        tongue  . Heart disease Father        massive heart attack  . Heart disease Maternal Grandfather        MI  . Aneurysm Paternal Grandmother        brain  . Breast cancer Maternal Aunt    Social History   Tobacco Use  . Smoking status: Former Smoker    Last attempt to quit: 01/26/2007    Years since quitting: 10.8  .  Smokeless tobacco: Never Used  Substance Use Topics  . Alcohol use: No    Alcohol/week: 0.0 oz  . Drug use: No   Review of Systems  Constitutional: Negative for unexpected weight change.  Eyes: Negative for visual disturbance.  Respiratory: Negative for wheezing.   Cardiovascular: Negative for chest pain.  Gastrointestinal: Negative for blood in stool.  Endocrine: Negative for polydipsia.  Genitourinary: Negative for hematuria.  Skin:       No worrisome moles  Allergic/Immunologic: Negative for food allergies.  Neurological: Negative for tremors.  Hematological: Negative for adenopathy. Does not bruise/bleed easily.  Psychiatric/Behavioral: Negative for dysphoric mood.    Objective:   Vitals:   11/16/17 1442  BP: 106/62  Pulse: 90  Resp: 12  Temp: 98.1 F (36.7 C)  TempSrc: Oral  SpO2: 96%  Weight: 153 lb 3.2 oz (69.5 kg)  Height: '5\' 3"'$  (1.6 m)   Body mass index is 27.14 kg/m. Wt Readings from Last 3 Encounters:  11/16/17 153 lb 3.2 oz (69.5 kg)  06/22/17 149 lb (67.6 kg)  07/25/16 149 lb 4.8 oz (67.7 kg)   Physical Exam  Constitutional: She appears well-developed and well-nourished.  HENT:  Head: Normocephalic and atraumatic.  Right Ear: Hearing, tympanic membrane, external ear and ear canal normal.  Left Ear: Hearing, tympanic membrane, external ear and ear canal normal.  Eyes: Conjunctivae and EOM are normal. Right eye exhibits no hordeolum. Left eye exhibits no hordeolum. No scleral icterus.  Neck: Carotid bruit is not present. No thyromegaly present.  Cardiovascular: Normal rate, regular rhythm, S1 normal, S2 normal and normal heart sounds.  No extrasystoles are present.  Pulmonary/Chest: Effort normal and breath sounds normal. No respiratory distress. Right breast exhibits no inverted nipple, no mass, no nipple discharge, no skin change and no tenderness. Left breast exhibits no inverted nipple, no mass, no nipple discharge, no skin change and no tenderness.  Breasts are symmetrical.  Abdominal: Soft. Normal appearance and bowel sounds are normal. She exhibits no distension, no abdominal bruit, no pulsatile midline mass and no mass. There is no hepatosplenomegaly. There is no tenderness. No hernia.  Musculoskeletal: Normal range of motion. She exhibits no edema.  Lymphadenopathy:       Head (right side): No submandibular adenopathy present.       Head (left side): No submandibular adenopathy present.    She has no cervical adenopathy.    She has no axillary adenopathy.  Neurological: She is alert.  She displays no tremor. No cranial nerve deficit. She exhibits normal muscle tone. Gait normal.  Reflex Scores:      Patellar reflexes are 2+ on the right side and 2+ on the left side. Skin: Skin is warm and dry. No bruising and no ecchymosis noted. No cyanosis. No pallor.  Psychiatric: Her speech is normal and behavior is normal. Thought content normal. Her mood appears not anxious. She does not exhibit a depressed mood.    Assessment/Plan:   Problem List Items Addressed This Visit      Other   Preventative health care - Primary    USPSTF grade A and B recommendations reviewed with patient; age-appropriate recommendations, preventive care, screening tests, etc discussed and encouraged; healthy living encouraged; see AVS for patient education given to patient       Relevant Orders   CBC with Differential/Platelet (Completed)   COMPLETE METABOLIC PANEL WITH GFR (Completed)   Lipid panel (Completed)   TSH (Completed)       No orders of the defined types were placed in this encounter.  Orders Placed This Encounter  Procedures  . CBC with Differential/Platelet  . COMPLETE METABOLIC PANEL WITH GFR  . Lipid panel  . TSH    Follow up plan: Return in about 1 year (around 11/17/2018) for complete physical.  An After Visit Summary was printed and given to the patient.

## 2017-11-17 LAB — COMPLETE METABOLIC PANEL WITH GFR
AG RATIO: 1.7 (calc) (ref 1.0–2.5)
ALT: 17 U/L (ref 6–29)
AST: 15 U/L (ref 10–35)
Albumin: 4.3 g/dL (ref 3.6–5.1)
Alkaline phosphatase (APISO): 58 U/L (ref 33–130)
BUN: 14 mg/dL (ref 7–25)
CALCIUM: 9.5 mg/dL (ref 8.6–10.4)
CO2: 28 mmol/L (ref 20–32)
Chloride: 104 mmol/L (ref 98–110)
Creat: 0.71 mg/dL (ref 0.50–1.05)
GFR, EST AFRICAN AMERICAN: 113 mL/min/{1.73_m2} (ref >=60)
GFR, EST NON AFRICAN AMERICAN: 97 mL/min/{1.73_m2} (ref >=60)
Globulin: 2.6 g/dL (calc) (ref 1.9–3.7)
Glucose, Bld: 91 mg/dL (ref 65–139)
POTASSIUM: 4 mmol/L (ref 3.5–5.3)
Sodium: 138 mmol/L (ref 135–146)
TOTAL PROTEIN: 6.9 g/dL (ref 6.1–8.1)
Total Bilirubin: 0.3 mg/dL (ref 0.2–1.2)

## 2017-11-17 LAB — LIPID PANEL
Cholesterol: 214 mg/dL — ABNORMAL HIGH (ref <200)
HDL: 47 mg/dL — ABNORMAL LOW (ref >50)
LDL Cholesterol (Calc): 145 mg/dL (calc) — ABNORMAL HIGH
NON-HDL CHOLESTEROL (CALC): 167 mg/dL — AB (ref <130)
Total CHOL/HDL Ratio: 4.6 (calc) (ref <5.0)
Triglycerides: 103 mg/dL (ref <150)

## 2017-11-17 LAB — CBC WITH DIFFERENTIAL/PLATELET
BASOS ABS: 41 {cells}/uL (ref 0–200)
Basophils Relative: 0.5 %
Eosinophils Absolute: 353 cells/uL (ref 15–500)
Eosinophils Relative: 4.3 %
HCT: 37.2 % (ref 35.0–45.0)
HEMOGLOBIN: 12.8 g/dL (ref 11.7–15.5)
Lymphs Abs: 2632 cells/uL (ref 850–3900)
MCH: 30.3 pg (ref 27.0–33.0)
MCHC: 34.4 g/dL (ref 32.0–36.0)
MCV: 87.9 fL (ref 80.0–100.0)
MONOS PCT: 8.3 %
MPV: 10.2 fL (ref 7.5–12.5)
NEUTROS ABS: 4494 {cells}/uL (ref 1500–7800)
Neutrophils Relative %: 54.8 %
Platelets: 317 10*3/uL (ref 140–400)
RBC: 4.23 10*6/uL (ref 3.80–5.10)
RDW: 11.7 % (ref 11.0–15.0)
Total Lymphocyte: 32.1 %
WBC mixed population: 681 cells/uL (ref 200–950)
WBC: 8.2 10*3/uL (ref 3.8–10.8)

## 2017-11-17 LAB — TSH: TSH: 1.6 m[IU]/L

## 2017-11-18 ENCOUNTER — Encounter: Payer: Self-pay | Admitting: Family Medicine

## 2017-11-18 DIAGNOSIS — E785 Hyperlipidemia, unspecified: Secondary | ICD-10-CM | POA: Insufficient documentation

## 2017-11-21 DIAGNOSIS — Z0000 Encounter for general adult medical examination without abnormal findings: Secondary | ICD-10-CM | POA: Insufficient documentation

## 2017-11-21 NOTE — Assessment & Plan Note (Signed)
USPSTF grade A and B recommendations reviewed with patient; age-appropriate recommendations, preventive care, screening tests, etc discussed and encouraged; healthy living encouraged; see AVS for patient education given to patient  

## 2018-09-11 ENCOUNTER — Encounter: Payer: Managed Care, Other (non HMO) | Admitting: Family Medicine

## 2018-10-16 ENCOUNTER — Other Ambulatory Visit: Payer: Self-pay | Admitting: Orthopedic Surgery

## 2018-10-16 ENCOUNTER — Other Ambulatory Visit: Payer: Self-pay | Admitting: Family Medicine

## 2018-10-16 DIAGNOSIS — M5412 Radiculopathy, cervical region: Secondary | ICD-10-CM

## 2018-10-16 DIAGNOSIS — Z1231 Encounter for screening mammogram for malignant neoplasm of breast: Secondary | ICD-10-CM

## 2018-10-28 ENCOUNTER — Other Ambulatory Visit: Payer: Self-pay

## 2018-10-28 ENCOUNTER — Ambulatory Visit
Admission: RE | Admit: 2018-10-28 | Discharge: 2018-10-28 | Disposition: A | Discharge status: Home / Self Care | Payer: Managed Care, Other (non HMO) | Source: Ambulatory Visit | Attending: Orthopedic Surgery | Admitting: Orthopedic Surgery

## 2018-10-28 DIAGNOSIS — M5412 Radiculopathy, cervical region: Secondary | ICD-10-CM

## 2018-11-18 ENCOUNTER — Other Ambulatory Visit: Payer: Self-pay

## 2018-11-18 ENCOUNTER — Ambulatory Visit
Admission: RE | Admit: 2018-11-18 | Discharge: 2018-11-18 | Disposition: A | Discharge status: Home / Self Care | Payer: Managed Care, Other (non HMO) | Source: Ambulatory Visit | Attending: Family Medicine | Admitting: Family Medicine

## 2018-11-18 DIAGNOSIS — Z1231 Encounter for screening mammogram for malignant neoplasm of breast: Secondary | ICD-10-CM | POA: Insufficient documentation

## 2018-11-19 ENCOUNTER — Encounter: Payer: Managed Care, Other (non HMO) | Admitting: Family Medicine

## 2018-12-04 ENCOUNTER — Encounter: Payer: Managed Care, Other (non HMO) | Admitting: Family Medicine

## 2018-12-09 NOTE — Progress Notes (Signed)
Name: Judy Blankenship   MRN: [id number]    DOB: April 05, 1966   Date:12/10/2018       Progress Note  Subjective  Chief Complaint  Chief Complaint  Patient presents with  . Annual Exam    HPI   Patient presents for annual CPE.  Diet:  2 meals a day  Usually skips breakfast- if she doesn't has a small fruit  Around lunch has a sandwich , noodles Dinner- protein, vegetable, & carb- ex spaghetti and salad. Sweet tea- a few times a week.  and water (4 bottles)  Exercise:  Had started the gym in February but stopped with the pandemic. Has been lazy and has not exercised in a while.   USPSTF grade A and B recommendations    Office Visit from 12/10/2018 in [facility]  AUDIT-C Score  0     Depression: Phq 9 is  negative Depression screen [facility] 2/9 12/10/2018 11/16/2017 06/22/2017 07/25/2016 01/26/2015  Decreased Interest 0 0 0 0 0  Down, Depressed, Hopeless 0 0 0 0 0  PHQ - 2 Score 0 0 0 0 0  Altered sleeping 0 0 - - -  Tired, decreased energy 0 0 - - -  Change in appetite 0 0 - - -  Feeling bad or failure about yourself  0 0 - - -  Trouble concentrating 0 0 - - -  Moving slowly or fidgety/restless 0 0 - - -  Suicidal thoughts 0 0 - - -  PHQ-9 Score 0 0 - - -  Difficult doing work/chores Not difficult at all Not difficult at all - - -   Hypertension: BP Readings from Last 3 Encounters:  12/10/18 122/74  11/16/17 106/62  07/06/17 110/70   Obesity: Wt Readings from Last 3 Encounters:  12/10/18 152 lb (68.9 kg)  11/16/17 153 lb 3.2 oz (69.5 kg)  06/22/17 149 lb (67.6 kg)   BMI Readings from Last 3 Encounters:  12/10/18 26.93 kg/m  11/16/17 27.14 kg/m  06/22/17 26.35 kg/m    Hep C Screening: will complete  STD testing and prevention (HIV/chl/gon/syphilis): decline  Intimate partner violence: denies  Sexual History/Pain during Intercourse: denies  Menstrual History/LMP/Abnormal Bleeding: LMP- 11/20/2018- no abnormal bleeding. Started on hormonal  therapies due to emotional stress and heavy periods.   Advanced Care Planning: A voluntary discussion about advance care planning including the explanation and discussion of advance directives.  Discussed health care proxy and Living will, and the patient was able to identify a health care proxy as husband robert Nydam.  Patient does not have a living will at present time. If patient does have living will, I have requested they bring this to the clinic to be scanned in to their chart.  Breast cancer: mammogram completed last month- and clear.  HM Mammogram  Date Value Ref Range Status  01/21/2013 from [facility]  Final   Cervical cancer screening: recommended annually due to hormonal therapies.   Lipids:  Lab Results  Component Value Date   CHOL 214 (H) 11/16/2017   CHOL 218 (H) 07/25/2016   Lab Results  Component Value Date   HDL 47 (L) 11/16/2017   HDL 49 07/25/2016   Lab Results  Component Value Date   LDLCALC 145 (H) 11/16/2017   LDLCALC 147 (H) 07/25/2016   Lab Results  Component Value Date   TRIG 103 11/16/2017   TRIG 111 07/25/2016   Lab Results  Component Value Date   CHOLHDL 4.6 11/16/2017  No results found for: LDLDIRECT  Glucose:  Glucose  Date Value Ref Range Status  07/25/2016 92 65 - 99 mg/dL Final   Glucose, Bld  Date Value Ref Range Status  11/16/2017 91 65 - 139 mg/dL Final    Comment:    .        Non-fasting reference interval .     Skin cancer: discussed  Colorectal cancer: due 2022   Lung cancer:  Low Dose CT Chest recommended if Age 95-80 years, 30 pack-year currently smoking OR have quit w/in 15years. Patient does not qualify.     Patient Active Problem List   Diagnosis Date Noted  . Preventative health care 11/21/2017  . Hyperlipidemia LDL goal <130 11/18/2017  . Migraine without aura 01/25/2015  . Anxiety and depression 01/25/2015    Past Surgical History:  Procedure Laterality Date  . CESAREAN SECTION    . TUBAL LIGATION       Family History  Problem Relation Age of Onset  . Cancer Mother        ovarian  . Cancer Father        tongue  . Heart disease Father        massive heart attack  . Heart disease Maternal Grandfather        MI  . Aneurysm Paternal Grandmother        brain  . Breast cancer Maternal Aunt     Social History   Socioeconomic History  . Marital status: Married    Spouse name: Herbie [place]  . Number of children: 2  . Years of education: 59  . Highest education level: Associate degree: academic program  Occupational History  . Not on file  Social Needs  . Financial resource strain: Not hard at all  . Food insecurity    Worry: Never true    Inability: Never true  . Transportation needs    Medical: No    Non-medical: No  Tobacco Use  . Smoking status: Former Smoker    Quit date: 01/26/2007    Years since quitting: 11.8  . Smokeless tobacco: Never Used  Substance and Sexual Activity  . Alcohol use: No    Alcohol/week: 0.0 standard drinks  . Drug use: No  . Sexual activity: Yes    Birth control/protection: None  Lifestyle  . Physical activity    Days per week: 0 days    Minutes per session: 0 min  . Stress: Not at all  Relationships  . Social connections    Talks on phone: More than three times a week    Gets together: More than three times a week    Attends religious service: More than 4 times per year    Active member of club or organization: No    Attends meetings of clubs or organizations: Never    Relationship status: Married  . Intimate partner violence    Fear of current or ex partner: No    Emotionally abused: No    Physically abused: No    Forced sexual activity: No  Other Topics Concern  . Not on file  Social History Narrative  . Not on file     Current Outpatient Medications:  .  estradiol (VIVELLE-DOT) 0.075 MG/24HR, Place 1 patch onto the skin 2 (two) times a week., Disp: , Rfl: 3 .  progesterone (PROMETRIUM) 100 MG capsule, Take 1 capsule by  mouth daily., Disp: , Rfl: 3  No Known Allergies   Review of Systems  Constitutional: Negative for chills, fever and malaise/fatigue.  HENT: Negative for congestion, sinus pain and sore throat.   Eyes: Negative for blurred vision.  Respiratory: Negative for cough and shortness of breath.   Cardiovascular: Negative for chest pain, palpitations and leg swelling.  Gastrointestinal: Negative for abdominal pain, blood in stool, constipation, diarrhea and nausea.  Genitourinary: Negative for dysuria.  Musculoskeletal: Negative for falls and joint pain.  Skin: Negative for rash.  Neurological: Negative for dizziness, tingling and headaches.  Endo/Heme/Allergies: Negative for polydipsia.  Psychiatric/Behavioral: The patient has insomnia (uncomfortable bed- getting a new one). The patient is not nervous/anxious.      Objective  Vitals:   12/10/18 0849  BP: 122/74  Pulse: 69  Resp: 14  Temp: (!) 97.3 F (36.3 C)  TempSrc: Temporal  SpO2: 98%  Weight: 152 lb (68.9 kg)  Height: 5\' 3"  (1.6 m)    Body mass index is 26.93 kg/m.  Physical Exam Vitals signs reviewed.  Constitutional:      Appearance: She is well-developed.  HENT:     Head: Normocephalic and atraumatic.  Neck:     Musculoskeletal: Normal range of motion and neck supple.     Vascular: No carotid bruit.  Cardiovascular:     Heart sounds: Normal heart sounds.  Pulmonary:     Effort: Pulmonary effort is normal.     Breath sounds: Normal breath sounds.  Abdominal:     General: Bowel sounds are normal.     Palpations: Abdomen is soft.     Tenderness: There is no abdominal tenderness.  Musculoskeletal: Normal range of motion.  Skin:    General: Skin is warm and dry.     Capillary Refill: Capillary refill takes less than 2 seconds.  Neurological:     Mental Status: She is alert and oriented to person, place, and time.     GCS: GCS eye subscore is 4. GCS verbal subscore is 5. GCS motor subscore is 6.     Sensory:  No sensory deficit.  Psychiatric:        Speech: Speech normal.        Behavior: Behavior normal.        Thought Content: Thought content normal.        Judgment: Judgment normal.       No results found for this or any previous visit (from the past 2160 hour(s)).  Fall Risk: Fall Risk  12/10/2018 06/22/2017 07/25/2016  Falls in the past year? 0 No No  Number falls in past yr: 0 - -  Injury with Fall? 0 - -     Functional Status Survey: Is the patient deaf or have difficulty hearing?: No Does the patient have difficulty seeing, even when wearing glasses/contacts?: No Does the patient have difficulty concentrating, remembering, or making decisions?: No Does the patient have difficulty walking or climbing stairs?: No Does the patient have difficulty dressing or bathing?: No Does the patient have difficulty doing errands alone such as visiting a doctor's office or shopping?: No  Assessment & Plan  1. Preventative health care - COMPLETE METABOLIC PANEL WITH GFR - Lipid Profile  2. Encounter for hepatitis C screening test for low risk patient - Hepatitis C Antibody  3. Screening for HIV (human immunodeficiency virus) - HIV antibody (with reflex)  4. Screening for cervical cancer - Cytology - PAP    -USPSTF grade A and B recommendations reviewed with patient; age-appropriate recommendations, preventive care, screening tests, etc discussed and encouraged; healthy living encouraged;  see AVS for patient education given to patient -Discussed importance of 150 minutes of physical activity weekly, eat two servings of fish weekly, eat one serving of tree nuts ( cashews, pistachios, pecans, almonds.Marland Kitchen) every other day, eat 6 servings of fruit/vegetables daily and drink plenty of water and avoid sweet beverages.   -Reviewed Health Maintenance: screenings ordered

## 2018-12-10 ENCOUNTER — Other Ambulatory Visit (HOSPITAL_COMMUNITY)
Admission: RE | Admit: 2018-12-10 | Discharge: 2018-12-10 | Disposition: A | Discharge status: Home / Self Care | Payer: Managed Care, Other (non HMO) | Source: Ambulatory Visit | Attending: Family Medicine | Admitting: Family Medicine

## 2018-12-10 ENCOUNTER — Encounter: Payer: Self-pay | Admitting: Nurse Practitioner

## 2018-12-10 ENCOUNTER — Other Ambulatory Visit: Payer: Self-pay

## 2018-12-10 ENCOUNTER — Ambulatory Visit (INDEPENDENT_AMBULATORY_CARE_PROVIDER_SITE_OTHER): Payer: Managed Care, Other (non HMO) | Admitting: Nurse Practitioner

## 2018-12-10 VITALS — BP 122/74 | HR 69 | Temp 97.3°F | Resp 14 | Ht 63.0 in | Wt 152.0 lb

## 2018-12-10 DIAGNOSIS — Z124 Encounter for screening for malignant neoplasm of cervix: Secondary | ICD-10-CM

## 2018-12-10 DIAGNOSIS — Z0000 Encounter for general adult medical examination without abnormal findings: Secondary | ICD-10-CM | POA: Diagnosis not present

## 2018-12-10 DIAGNOSIS — Z1159 Encounter for screening for other viral diseases: Secondary | ICD-10-CM | POA: Diagnosis not present

## 2018-12-10 DIAGNOSIS — Z114 Encounter for screening for human immunodeficiency virus [HIV]: Secondary | ICD-10-CM

## 2018-12-10 NOTE — Patient Instructions (Signed)
If you have not heard anything from my staff in a week about any orders/referrals/studies from today, please contact us here to follow-up [phone] or mychart.   General recommendations: 150 minutes of physical activity weekly, eat two servings of fish weekly, eat one serving of tree nuts ( cashews, pistachios, pecans, almonds.Marland Kitchen) every other day, eat 6 servings of fruit/vegetables daily and drink plenty of water and avoid sweet beverages. Recommend at least 64 ounces of water daily.   Stay Safe in the [place] The majority of sun exposure occurs before age 76 and skin cancer can take 20 years or more to develop. Whether your sun bathing days are behind you or you still spend time pursuing the perfect tan, you should be concerned about skin cancer.  Remember, the sun's ultraviolet (UV) rays can reflect off water, sand, concrete and snow, and can reach below the water's surface. Certain types of UV light penetrate fog and clouds, so it's possible to get sunburn even on overcast days.  Avoid direct sunlight as much as possible during the peak sun hours, generally 10 a.m. to 3 p.m., or seek shade during this part of day. Wear broad-spectrum sunscreen - with an SPF of at least 30 - containing both UVA and UVB protection. Look for ingredients like [employer] (also known as avobenzone) or titanium dioxide on the label. Reapply sunscreen frequently, at least every two hours when outdoors, especially if you perspire or you've been swimming. Your best bet is to choose water-resistant products that are more likely to stay on your skin. Wear lip balm with an SPF 15 or higher. Wear a hat and other protective clothing while in the sun. Tightly woven fibers and darker clothing generally provide more protection. Also, look for products approved by the American Academy of Dermatology. Wear UV-protective sunglasses.

## 2018-12-11 LAB — COMPLETE METABOLIC PANEL WITH GFR
AG Ratio: 1.5 (calc) (ref 1.0–2.5)
ALT: 14 U/L (ref 6–29)
AST: 15 U/L (ref 10–35)
Albumin: 4.1 g/dL (ref 3.6–5.1)
Alkaline phosphatase (APISO): 51 U/L (ref 37–153)
BUN: 11 mg/dL (ref 7–25)
CO2: 27 mmol/L (ref 20–32)
Calcium: 9.3 mg/dL (ref 8.6–10.4)
Chloride: 104 mmol/L (ref 98–110)
Creat: 0.69 mg/dL (ref 0.50–1.05)
GFR, Est African American: 114 mL/min/{1.73_m2} (ref >=60)
GFR, Est Non African American: 99 mL/min/{1.73_m2} (ref >=60)
Globulin: 2.7 g/dL (calc) (ref 1.9–3.7)
Glucose, Bld: 93 mg/dL (ref 65–99)
Potassium: 4 mmol/L (ref 3.5–5.3)
Sodium: 138 mmol/L (ref 135–146)
Total Bilirubin: 0.4 mg/dL (ref 0.2–1.2)
Total Protein: 6.8 g/dL (ref 6.1–8.1)

## 2018-12-11 LAB — CYTOLOGY - PAP
Adequacy: ABSENT
Diagnosis: NEGATIVE

## 2018-12-11 LAB — HEPATITIS C ANTIBODY
Hepatitis C Ab: NONREACTIVE
SIGNAL TO CUT-OFF: 0.01 (ref <1.00)

## 2018-12-11 LAB — LIPID PANEL
Cholesterol: 190 mg/dL (ref <200)
HDL: 44 mg/dL — ABNORMAL LOW (ref >=50)
LDL Cholesterol (Calc): 124 mg/dL (calc) — ABNORMAL HIGH
Non-HDL Cholesterol (Calc): 146 mg/dL (calc) — ABNORMAL HIGH (ref <130)
Total CHOL/HDL Ratio: 4.3 (calc) (ref <5.0)
Triglycerides: 113 mg/dL (ref <150)

## 2018-12-11 LAB — HIV ANTIBODY (ROUTINE TESTING W REFLEX): HIV 1&2 Ab, 4th Generation: NONREACTIVE

## 2019-08-09 ENCOUNTER — Other Ambulatory Visit: Payer: Self-pay

## 2019-08-09 ENCOUNTER — Emergency Department
Admission: EM | Admit: 2019-08-09 | Discharge: 2019-08-09 | Disposition: A | Discharge status: Home / Self Care | Payer: Managed Care, Other (non HMO) | Attending: Student | Admitting: Student

## 2019-08-09 ENCOUNTER — Emergency Department: Payer: Managed Care, Other (non HMO)

## 2019-08-09 DIAGNOSIS — Y999 Unspecified external cause status: Secondary | ICD-10-CM | POA: Diagnosis not present

## 2019-08-09 DIAGNOSIS — Y929 Unspecified place or not applicable: Secondary | ICD-10-CM | POA: Diagnosis not present

## 2019-08-09 DIAGNOSIS — W293XXA Contact with powered garden and outdoor hand tools and machinery, initial encounter: Secondary | ICD-10-CM | POA: Insufficient documentation

## 2019-08-09 DIAGNOSIS — S61215A Laceration without foreign body of left ring finger without damage to nail, initial encounter: Secondary | ICD-10-CM | POA: Diagnosis not present

## 2019-08-09 DIAGNOSIS — Y93H2 Activity, gardening and landscaping: Secondary | ICD-10-CM | POA: Diagnosis not present

## 2019-08-09 MED ORDER — LIDOCAINE-EPINEPHRINE-TETRACAINE (LET) SOLUTION: 3.0000 mL | Freq: Once | NASAL | Status: DC

## 2019-08-09 MED ORDER — IBUPROFEN 800 MG PO TABS
800.0000 mg | ORAL_TABLET | Freq: Once | ORAL | Status: AC
  Administered 2019-08-09: 800 mg via ORAL
  Filled 2019-08-09: qty 1

## 2019-08-09 MED ORDER — IBUPROFEN 800 MG PO TABS: 800.0000 mg | ORAL_TABLET | Freq: Three times a day (TID) | ORAL | 0 refills | Status: DC | PRN

## 2019-08-09 MED ORDER — SULFAMETHOXAZOLE-TRIMETHOPRIM 800-160 MG PO TABS: 1.0000 | ORAL_TABLET | Freq: Two times a day (BID) | ORAL | 0 refills | Status: DC

## 2019-08-09 MED ORDER — LIDOCAINE HCL (PF) 1 % IJ SOLN
5.0000 mL | Freq: Once | INTRAMUSCULAR | Status: AC
  Administered 2019-08-09: 5 mL
  Filled 2019-08-09: qty 5

## 2019-08-09 MED ORDER — TETANUS-DIPHTH-ACELL PERTUSSIS 5-2.5-18.5 LF-MCG/0.5 IM SUSP
0.5000 mL | Freq: Once | INTRAMUSCULAR | Status: DC
  Filled 2019-08-09: qty 0.5

## 2019-08-09 MED ORDER — SULFAMETHOXAZOLE-TRIMETHOPRIM 800-160 MG PO TABS
1.0000 | ORAL_TABLET | Freq: Once | ORAL | Status: AC
  Administered 2019-08-09: 1 via ORAL
  Filled 2019-08-09: qty 1

## 2019-08-09 NOTE — ED Provider Notes (Signed)
[facility] Emergency Department Provider Note ____________________________________________  Time seen: 1730  I have reviewed the triage vital signs and the nursing notes.  HISTORY  Chief Complaint  Laceration  HPI Judy Blankenship is a 54 y.o. female presents to the ED for evaluation of accidental laceration to the left middle finger.  Patient was using electric hedge tremors when she excellently cut her finger.  She presents now for evaluation management of her finger laceration.  She denies any other injury at this time.  She does report an out of date tetanus status, but admits to receiving her initial Covid vaccine dose today.   Past Medical History:  Diagnosis Date  . Anxiety   . Depression   . Migraine     Patient Active Problem List   Diagnosis Date Noted  . Preventative health care 11/21/2017  . Hyperlipidemia LDL goal <130 11/18/2017  . Migraine without aura 01/25/2015  . Anxiety and depression 01/25/2015    Past Surgical History:  Procedure Laterality Date  . CESAREAN SECTION    . TUBAL LIGATION      Prior to Admission medications   Medication Sig Start Date End Date Taking? Authorizing Provider  estradiol (VIVELLE-DOT) 0.075 MG/24HR Place 1 patch onto the skin 2 (two) times a week. 10/26/17   [provider]  ibuprofen (ADVIL) 800 MG tablet Take 1 tablet (800 mg total) by mouth every 8 (eight) hours as needed. 08/09/19   Monic Engelmann, Dannielle Karvonen, PA-C  progesterone (PROMETRIUM) 100 MG capsule Take 1 capsule by mouth daily. 10/25/17   [provider]  sulfamethoxazole-trimethoprim (BACTRIM DS) 800-160 MG tablet Take 1 tablet by mouth 2 (two) times daily. 08/09/19   Yaretzi Ernandez, Dannielle Karvonen, PA-C    Allergies Patient has no known allergies.  Family History  Problem Relation Age of Onset  . Cancer Mother        ovarian  . Cancer Father        tongue  . Heart disease Father        massive heart attack  . Heart disease  Maternal Grandfather        MI  . Aneurysm Paternal Grandmother        brain  . Breast cancer Maternal Aunt     Social History Social History   Tobacco Use  . Smoking status: Former Smoker    Quit date: 01/26/2007    Years since quitting: 12.5  . Smokeless tobacco: Never Used  Substance Use Topics  . Alcohol use: No    Alcohol/week: 0.0 standard drinks  . Drug use: No    Review of Systems  Constitutional: Negative for fever. Cardiovascular: Negative for chest pain. Respiratory: Negative for shortness of breath. Musculoskeletal: Negative for back pain. Skin: Negative for rash. Left ring finger laceration.  Neurological: Negative for headaches, focal weakness or numbness. ____________________________________________  PHYSICAL EXAM:  VITAL SIGNS: ED Triage Vitals  Enc Vitals Group     BP 08/09/19 1620 130/73     Pulse Rate 08/09/19 1620 76     Resp 08/09/19 1620 18     Temp 08/09/19 1620 98.5 F (36.9 C)     Temp Source 08/09/19 1620 Oral     SpO2 08/09/19 1620 98 %     Weight 08/09/19 1621 150 lb (68 kg)     Height 08/09/19 1621 5\' 3"  (1.6 m)     Head Circumference --      Peak Flow --  Pain Score 08/09/19 1621 9     Pain Loc --      Pain Edu? --      Excl. in [place]? --     Constitutional: Alert and oriented. Well appearing and in no distress. Head: Normocephalic and atraumatic. Eyes: Conjunctivae are normal. Normal extraocular movements Cardiovascular: Normal rate, regular rhythm. Normal distal pulses. Respiratory: Normal respiratory effort. No wheezes/rales/rhonchi. Musculoskeletal: Normal composite fist on the left hand.  The left ring finger with soft tissue disruption to the distal fat pad. nontender with normal range of motion in all extremities.  Neurologic:  Normal gross sensation. Normal speech and language. No gross focal neurologic deficits are appreciated. Skin:  Skin is warm, dry and intact. No rash  noted. ___________________________________________   RADIOLOGY  DG Left Ring Finger   IMPRESSION: Laceration without fracture or foreign body. ___________________________________________  PROCEDURES Bactrim DS 1 PO  .Marland KitchenLaceration Repair  Date/Time: 08/09/2019 6:03 PM Performed by: Melvenia Needles, PA-C Authorized by: Melvenia Needles, PA-C   Consent:    Consent obtained:  Verbal   Consent given by:  Patient   Risks discussed:  Pain and poor wound healing Anesthesia (see MAR for exact dosages):    Anesthesia method:  Nerve block   Block location:  Left ring finger flexor tendon   Block anesthetic:  Lidocaine 1% w/o epi   Block technique:  Transthecal    Block injection procedure:  Anatomic landmarks palpated, introduced needle and incremental injection   Block outcome:  Anesthesia achieved Laceration details:    Location:  Finger   Finger location:  L ring finger Repair type:    Repair type:  Simple Pre-procedure details:    Preparation:  Patient was prepped and draped in usual sterile fashion Treatment:    Area cleansed with:  Betadine Skin repair:    Repair method:  Sutures   Suture size:  4-0   Suture material:  Nylon   Suture technique:  Simple interrupted   Number of sutures:  9 Approximation:    Approximation:  Close Post-procedure details:    Dressing:  Non-adherent dressing and bulky dressing   Patient tolerance of procedure:  Tolerated well, no immediate complications   ____________________________________________  INITIAL IMPRESSION / ASSESSMENT AND PLAN / ED COURSE  Patient with ED evaluation of an accidental laceration to the left ring finger.  She sustained soft tissue injury while using electric hedge tremors.  She presents now for management of her symptoms.  There is no radiologic evidence of fracture, dislocation, or retained foreign body.  Patient soft tissue injury was repaired using sutures.  She is discharged with wound care  instructions and supplies.  She will see her primary provider in 10 to 12 days for suture removal.  She will update her tetanus 4 weeks after her second Covid vaccine.  Judy Blankenship was evaluated in Emergency Department on 08/09/2019 for the symptoms described in the history of present illness. She was evaluated in the context of the global COVID-19 pandemic, which necessitated consideration that the patient might be at risk for infection with the SARS-CoV-2 virus that causes COVID-19. Institutional protocols and algorithms that pertain to the evaluation of patients at risk for COVID-19 are in a state of rapid change based on information released by regulatory bodies including the CDC and federal and state organizations. These policies and algorithms were followed during the patient's care in the ED. ____________________________________________  FINAL CLINICAL IMPRESSION(S) / ED DIAGNOSES  Final diagnoses:  Laceration of left ring finger without foreign body without damage to nail, initial encounter      Melvenia Needles, PA-C 08/09/19 2005    Lilia Pro., MD 08/10/19 1124

## 2019-08-09 NOTE — Discharge Instructions (Addendum)
Keep the wound clean, dry, and covered. Take the antibiotic as directed and the pain medicine as needed. See your provider in 10-14 days for suture removal.

## 2019-08-09 NOTE — ED Notes (Signed)
Pt presents to ED today w/ laceration on ring finger of left hand. Laceration occurred working in the yard cutting with shears.

## 2019-08-09 NOTE — ED Triage Notes (Signed)
Cut distal end of L ring finger on hedge cutters. Has rag around finger to stop bleeding.  No blood thinner use. Unknown on tetanus.

## 2019-09-11 ENCOUNTER — Other Ambulatory Visit: Payer: Self-pay | Admitting: Obstetrics and Gynecology

## 2020-01-06 ENCOUNTER — Other Ambulatory Visit: Payer: Self-pay | Admitting: Obstetrics and Gynecology

## 2020-01-06 DIAGNOSIS — Z1231 Encounter for screening mammogram for malignant neoplasm of breast: Secondary | ICD-10-CM

## 2020-01-13 HISTORY — PX: TRIGGER FINGER RELEASE: SHX641

## 2020-01-28 ENCOUNTER — Ambulatory Visit
Admission: RE | Admit: 2020-01-28 | Discharge: 2020-01-28 | Disposition: A | Discharge status: Home / Self Care | Payer: Managed Care, Other (non HMO) | Source: Ambulatory Visit | Attending: Obstetrics and Gynecology | Admitting: Obstetrics and Gynecology

## 2020-01-28 ENCOUNTER — Other Ambulatory Visit: Payer: Self-pay

## 2020-01-28 DIAGNOSIS — Z1231 Encounter for screening mammogram for malignant neoplasm of breast: Secondary | ICD-10-CM | POA: Diagnosis present

## 2020-01-31 IMAGING — US US PELVIS COMPLETE
1 series · 13 of 25 positions shown · non-contrast
Comparison: None

CLINICAL DATA: Irregular menstrual cycle.  Post coital bleeding.

EXAM:
TRANSABDOMINAL AND TRANSVAGINAL ULTRASOUND OF PELVIS
TECHNIQUE: Both transabdominal and transvaginal ultrasound examinations of the
pelvis were performed. Transabdominal technique was performed for
global imaging of the pelvis including uterus, ovaries, adnexal
regions, and pelvic cul-de-sac. It was necessary to proceed with
endovaginal exam following the transabdominal exam to visualize the
endometrium and ovaries.

[Series 1: us pelvis complete · 0.21mm/px · 13 of 91 slices shown]
[im 1/91]
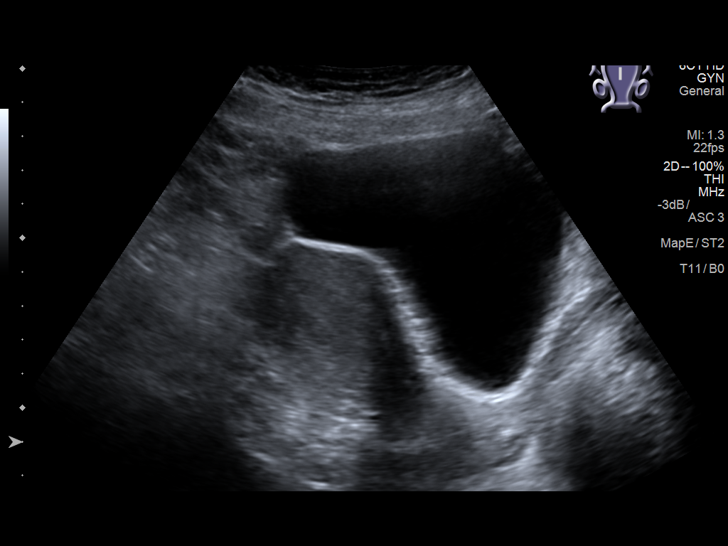
[im 8/91]
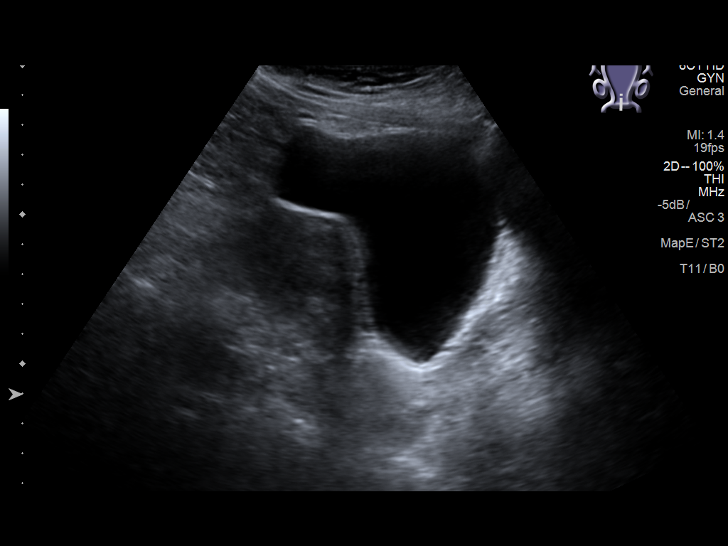
[im 16/91]
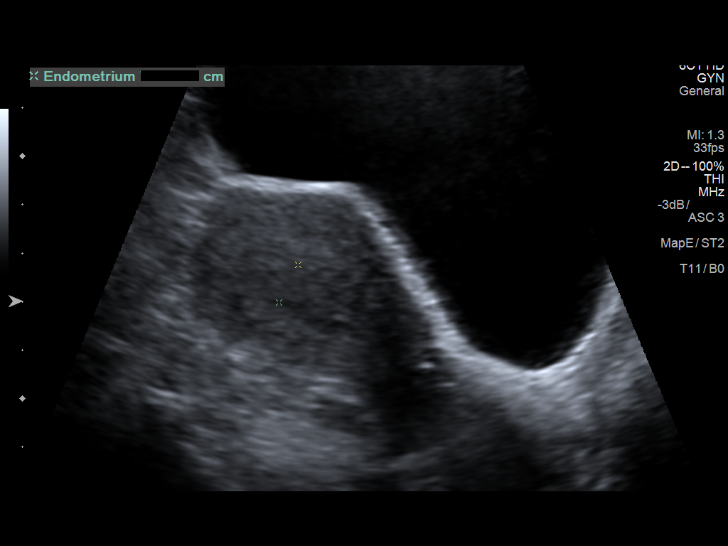
[im 23/91]
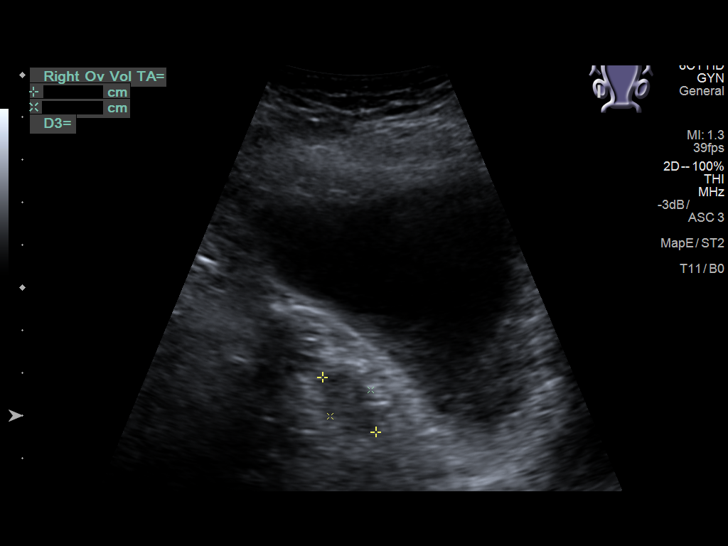
[im 31/91]
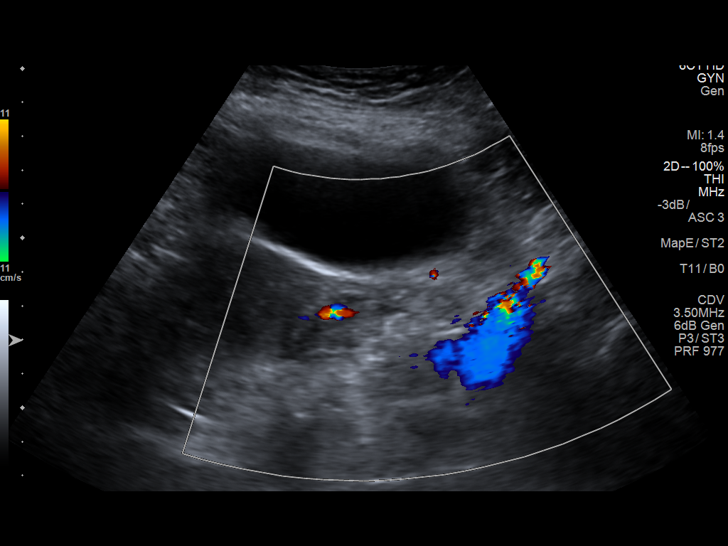
[im 38/91]
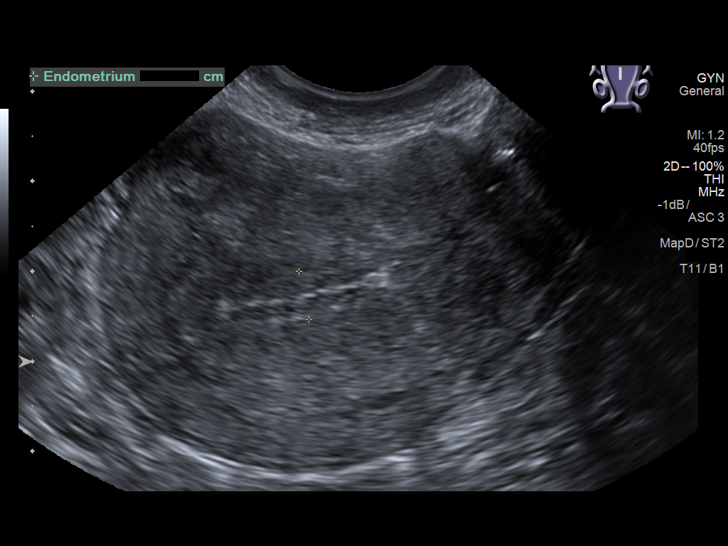
[im 46/91]
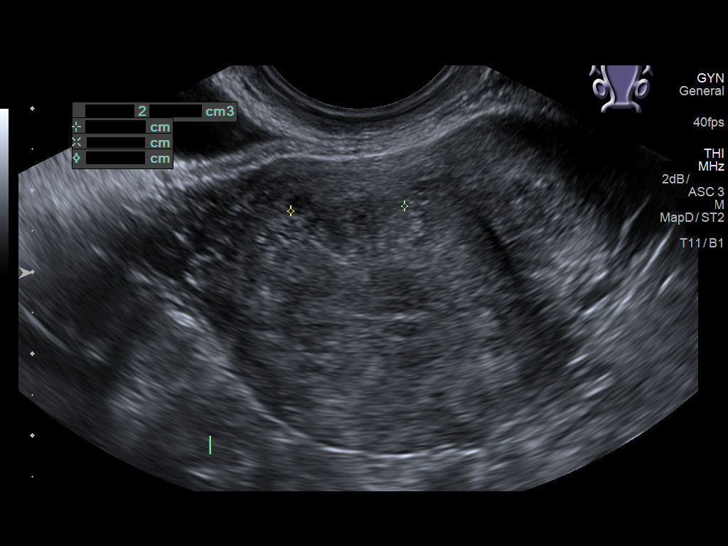
[im 53/91]
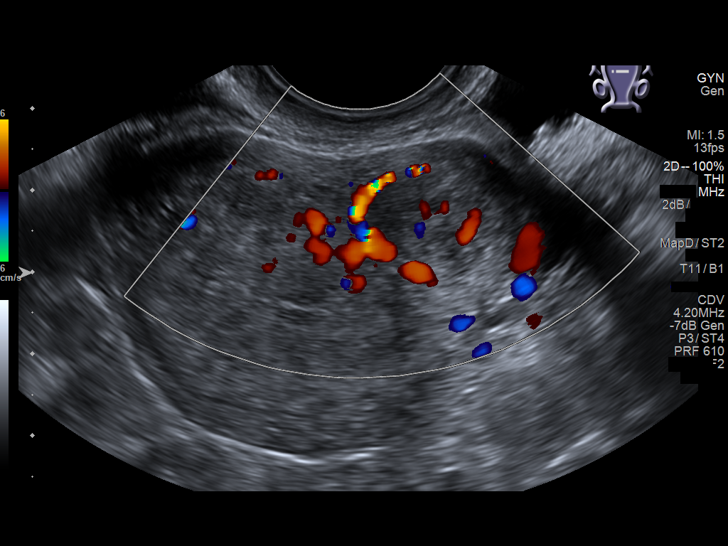
[im 61/91]
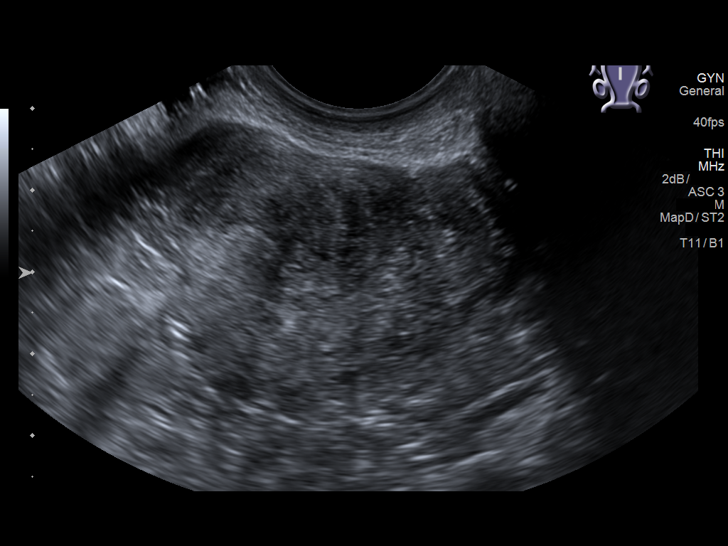
[im 68/91]
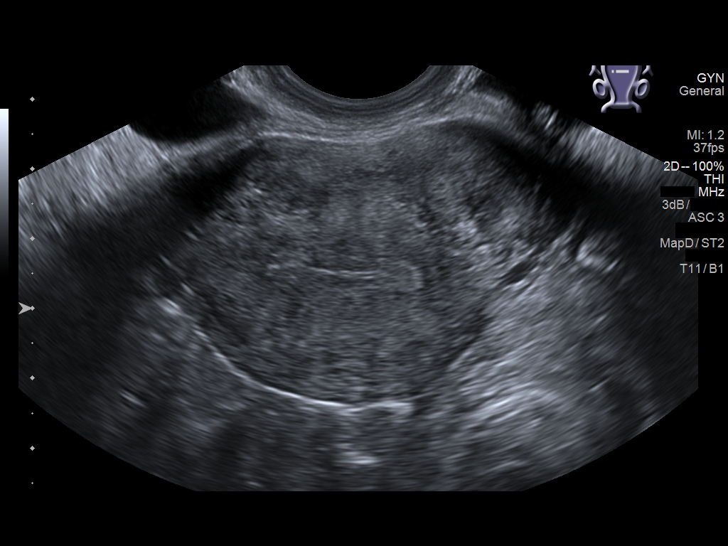
[im 76/91]
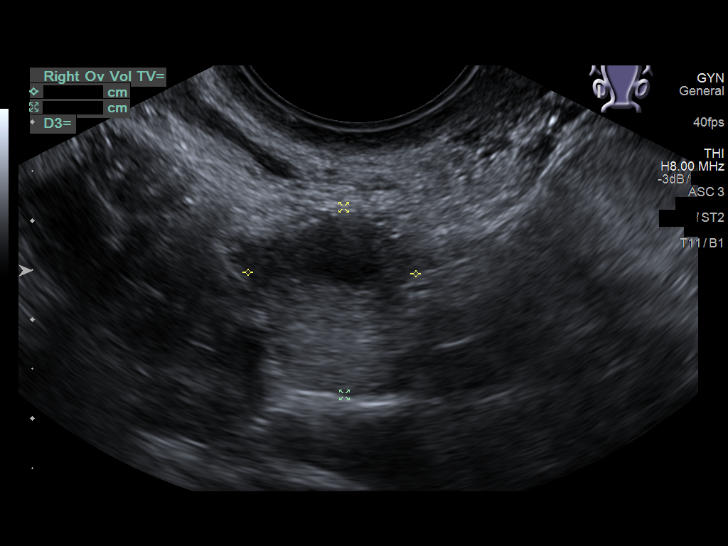
[im 83/91]
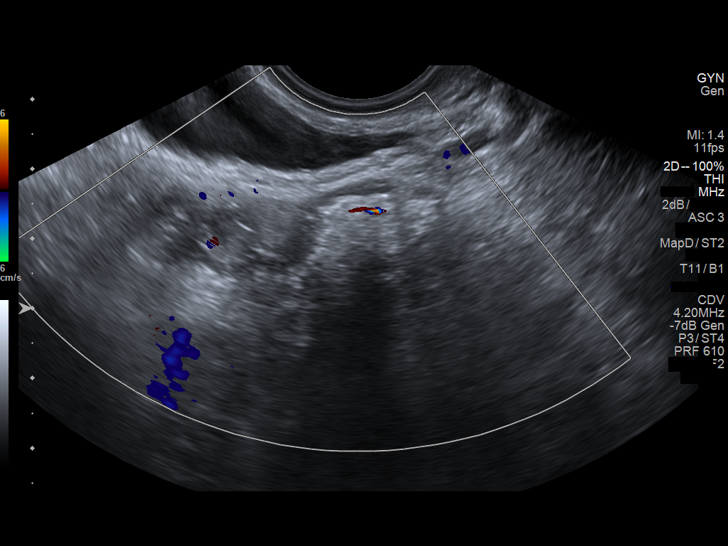
[im 91/91]
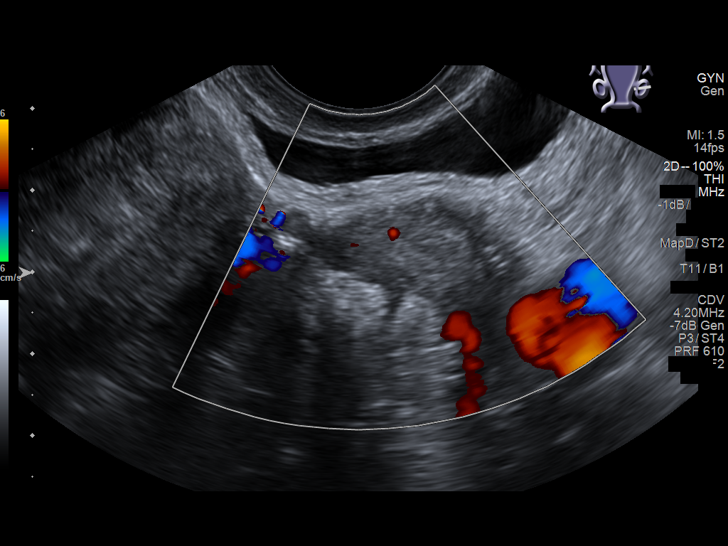

[13 of 25 positions shown; findings below may reference images not displayed]

FINDINGS: Uterus

Measurements: 9.2 x 4.2 x 5.0 cm. Two masses in the uterus are
identified consistent with fibroids. The largest is in the left side
of the anterior fundus measuring 3.7 x 2.1 x 1.9 cm. The smaller
mass to the right of the fundus measures 1.0 x 0.7 x 1.4 cm.

Endometrium

Thickness: 6.2 mm.  No focal abnormality visualized.

Right ovary

Measurements: 1.7 x 1.9 x 1.6 cm. Normal appearance/no adnexal mass.

Left ovary

Measurements: 1.5 x 1.8 x 2.1 cm. Normal appearance/no adnexal mass.

Other findings

No abnormal free fluid.
IMPRESSION: 1. Two fibroids are seen in the uterus. No other abnormalities
identified. Specifically, no endometrial mass or thickening
identified.

## 2020-11-12 ENCOUNTER — Telehealth: Payer: Self-pay | Admitting: Internal Medicine

## 2020-11-12 NOTE — Telephone Encounter (Signed)
PT called in to inform that she would like to have labs done for urine collection as she thinks she may have a UTI. PT is currently not a PT of Dr.Tullo but states that Dr.Tullo said she can call us for assistance if she needed anything before her 8/31 apt. PT would like to be called in regards to this and have it done today if possible.

## 2020-11-12 NOTE — Telephone Encounter (Signed)
LMTCB

## 2020-11-12 NOTE — Telephone Encounter (Signed)
Pt was advised to go to [facility] due to no available appts for today. Pt gave a verbal understanding.

## 2021-01-26 ENCOUNTER — Encounter: Payer: Self-pay | Admitting: Internal Medicine

## 2021-01-26 ENCOUNTER — Other Ambulatory Visit: Payer: Self-pay

## 2021-01-26 ENCOUNTER — Ambulatory Visit: Payer: Managed Care, Other (non HMO) | Admitting: Internal Medicine

## 2021-01-26 VITALS — BP 108/70 | HR 67 | Temp 98.2°F | Ht 63.78 in | Wt 157.2 lb

## 2021-01-26 DIAGNOSIS — D126 Benign neoplasm of colon, unspecified: Secondary | ICD-10-CM | POA: Diagnosis not present

## 2021-01-26 DIAGNOSIS — E785 Hyperlipidemia, unspecified: Secondary | ICD-10-CM | POA: Diagnosis not present

## 2021-01-26 DIAGNOSIS — Z1231 Encounter for screening mammogram for malignant neoplasm of breast: Secondary | ICD-10-CM | POA: Diagnosis not present

## 2021-01-26 DIAGNOSIS — F32A Depression, unspecified: Secondary | ICD-10-CM

## 2021-01-26 DIAGNOSIS — E559 Vitamin D deficiency, unspecified: Secondary | ICD-10-CM

## 2021-01-26 DIAGNOSIS — F419 Anxiety disorder, unspecified: Secondary | ICD-10-CM

## 2021-01-26 DIAGNOSIS — Z0000 Encounter for general adult medical examination without abnormal findings: Secondary | ICD-10-CM | POA: Diagnosis not present

## 2021-01-26 LAB — CBC WITH DIFFERENTIAL/PLATELET
Basophils Absolute: 0 10*3/uL (ref 0.0–0.1)
Basophils Relative: 0.7 % (ref 0.0–3.0)
Eosinophils Absolute: 0.3 10*3/uL (ref 0.0–0.7)
Eosinophils Relative: 4.5 % (ref 0.0–5.0)
HCT: 38.3 % (ref 36.0–46.0)
Hemoglobin: 12.8 g/dL (ref 12.0–15.0)
Lymphocytes Relative: 34.5 % (ref 12.0–46.0)
Lymphs Abs: 1.9 10*3/uL (ref 0.7–4.0)
MCHC: 33.4 g/dL (ref 30.0–36.0)
MCV: 90.2 fl (ref 78.0–100.0)
Monocytes Absolute: 0.5 10*3/uL (ref 0.1–1.0)
Monocytes Relative: 9.3 % (ref 3.0–12.0)
Neutro Abs: 2.9 10*3/uL (ref 1.4–7.7)
Neutrophils Relative %: 51 % (ref 43.0–77.0)
Platelets: 275 10*3/uL (ref 150.0–400.0)
RBC: 4.25 Mil/uL (ref 3.87–5.11)
RDW: 13 % (ref 11.5–15.5)
WBC: 5.6 10*3/uL (ref 4.0–10.5)

## 2021-01-26 LAB — COMPREHENSIVE METABOLIC PANEL
ALT: 18 U/L (ref 0–35)
AST: 18 U/L (ref 0–37)
Albumin: 4.4 g/dL (ref 3.5–5.2)
Alkaline Phosphatase: 67 U/L (ref 39–117)
BUN: 16 mg/dL (ref 6–23)
CO2: 26 mEq/L (ref 19–32)
Calcium: 9.7 mg/dL (ref 8.4–10.5)
Chloride: 103 mEq/L (ref 96–112)
Creatinine, Ser: 0.66 mg/dL (ref 0.40–1.20)
GFR: 98.15 mL/min (ref >60.00)
Glucose, Bld: 88 mg/dL (ref 70–99)
Potassium: 4.1 mEq/L (ref 3.5–5.1)
Sodium: 138 mEq/L (ref 135–145)
Total Bilirubin: 0.4 mg/dL (ref 0.2–1.2)
Total Protein: 7.2 g/dL (ref 6.0–8.3)

## 2021-01-26 LAB — LIPID PANEL
Cholesterol: 242 mg/dL — ABNORMAL HIGH (ref 0–200)
HDL: 53 mg/dL (ref >39.00)
LDL Cholesterol: 171 mg/dL — ABNORMAL HIGH (ref 0–99)
NonHDL: 189.24
Total CHOL/HDL Ratio: 5
Triglycerides: 91 mg/dL (ref 0.0–149.0)
VLDL: 18.2 mg/dL (ref 0.0–40.0)

## 2021-01-26 LAB — VITAMIN D 25 HYDROXY (VIT D DEFICIENCY, FRACTURES): VITD: 22.08 ng/mL — ABNORMAL LOW (ref 30.00–100.00)

## 2021-01-26 LAB — TSH: TSH: 1.11 u[IU]/mL (ref 0.35–5.50)

## 2021-01-26 MED ORDER — VENLAFAXINE HCL ER 150 MG PO CP24: 150.0000 mg | ORAL_CAPSULE | Freq: Every day | ORAL | 1 refills | Status: DC

## 2021-01-26 NOTE — Progress Notes (Addendum)
Subjective:  Patient ID: Judy Blankenship, female    DOB: 04/30/1966  Age: 55 y.o. MRN: [id number]  CC: The primary encounter diagnosis was Encounter for preventive health examination. Diagnoses of Encounter for screening mammogram for malignant neoplasm of breast, Tubular adenoma of colon, Hyperlipidemia LDL goal <130, Vitamin D deficiency, Anxiety and depression, and Preventative health care were also pertinent to this visit.  HPI Judy Blankenship presents for establishment of care .   Dr Leafy Ro her gynecologist prescribed zoloft 100 mg 1-2 years ago for management of mood disorder triggered by menopause and her  mother's struggle and  eventual death from  [place].  Currently taking 200 mg daily .  She feels the medication is Working well except for recurrent difficulty maintaining focused concentration at work, which has been under transition due to [place] and  staffing changes.   Discussed side effects:  she has a general lack of  libido,  discussed options including wellbutrin and  effexor.    History Judy Blankenship has a past medical history of Allergy, Anxiety, Depression, and Migraine.   She has a past surgical history that includes Cesarean section; Tubal ligation; and Trigger finger release (Right, 01/13/2020).   Her family history includes Alcohol abuse in her maternal grandfather and paternal grandmother; Aneurysm in her paternal grandmother; Breast cancer in her maternal aunt; COPD in her father, mother, and paternal grandmother; Cancer in her father and mother; Depression in her father; Early death in her mother; Hearing loss in her father, maternal grandmother, and mother; Heart attack in her father; Heart disease in her father and maternal grandfather; Hyperlipidemia in her father, maternal grandmother, and mother; Hypertension in her father, maternal grandfather, maternal grandmother, and mother; Kidney disease in her mother.She reports that she quit smoking about 14 years ago. Her smoking use  included cigarettes. She has never used smokeless tobacco. She reports that she does not drink alcohol and does not use drugs.  Outpatient Medications Prior to Visit  Medication Sig Dispense Refill   sertraline (ZOLOFT) 100 MG tablet Take 200 mg by mouth daily.     ibuprofen (ADVIL) 800 MG tablet Take 1 tablet (800 mg total) by mouth every 8 (eight) hours as needed. (Patient not taking: Reported on 01/26/2021) 30 tablet 0   progesterone (PROMETRIUM) 100 MG capsule Take 1 capsule by mouth daily. (Patient not taking: Reported on 01/26/2021)  3   estradiol (VIVELLE-DOT) 0.075 MG/24HR Place 1 patch onto the skin 2 (two) times a week. (Patient not taking: Reported on 01/26/2021)  3   sulfamethoxazole-trimethoprim (BACTRIM DS) 800-160 MG tablet Take 1 tablet by mouth 2 (two) times daily. (Patient not taking: Reported on 01/26/2021) 20 tablet 0   No facility-administered medications prior to visit.    Review of Systems:  Patient denies headache, fevers, malaise, unintentional weight loss, skin rash, eye pain, sinus congestion and sinus pain, sore throat, dysphagia,  hemoptysis , cough, dyspnea, wheezing, chest pain, palpitations, orthopnea, edema, abdominal pain, nausea, melena, diarrhea, constipation, flank pain, dysuria, hematuria, urinary  Frequency, nocturia, numbness, tingling, seizures,  Focal weakness, Loss of consciousness,  Tremor, insomnia, depression, anxiety, and suicidal ideation.     Objective:  BP 108/70 (BP Location: Left Arm, Patient Position: Sitting)   Pulse 67   Temp 98.2 F (36.8 C)   Ht 5' 3.78" (1.62 m)   Wt 157 lb 3.2 oz (71.3 kg)   LMP 06/08/2017   SpO2 97%   BMI 27.17 kg/m   Physical Exam:  General appearance:  alert, cooperative and appears stated age Ears: normal TM's and external ear canals both ears Throat: lips, mucosa, and tongue normal; teeth and gums normal Neck: no adenopathy, no carotid bruit, supple, symmetrical, trachea midline and thyroid not enlarged,  symmetric, no tenderness/mass/nodules Back: symmetric, no curvature. ROM normal. No CVA tenderness. Lungs: clear to auscultation bilaterally Heart: regular rate and rhythm, S1, S2 normal, no murmur, click, rub or gallop Abdomen: soft, non-tender; bowel sounds normal; no masses,  no organomegaly Pulses: 2+ and symmetric Skin: Skin color, texture, turgor normal. No rashes or lesions Lymph nodes: Cervical, supraclavicular, and axillary nodes normal. Psych: affect normal, makes good eye contact. No fidgeting,  Smiles easily.  Denies suicidal thoughts    Assessment & Plan:   Problem List Items Addressed This Visit       Unprioritized   Anxiety and depression    Changing from zoloft 200 mg dose  to effexor due to decreased libido. Starting with 150 mg dose       Hyperlipidemia LDL goal <130    10 yr risk remains < 5%   .  No statin therapy advixed  Lab Results  Component Value Date   CHOL 242 (H) 01/26/2021   HDL 53.00 01/26/2021   LDLCALC 171 (H) 01/26/2021   TRIG 91.0 01/26/2021   CHOLHDL 5 01/26/2021         Preventative health care    age appropriate education and counseling updated, referrals for preventative services and immunizations addressed, dietary and smoking counseling addressed, most recent labs reviewed.  I have personally reviewed and have noted:   1) the patient's medical and social history 2) The pt's use of alcohol, tobacco, and illicit drugs 3) The patient's current medications and supplements 4) Functional ability including ADL's, fall risk, home safety risk, hearing and visual impairment 5) Diet and physical activities 6) Evidence for depression or mood disorder 7) The patient's height, weight, and BMI have been recorded in the chart   I have made referrals, and provided counseling and education based on review of the above      Tubular adenoma of colon    Done by Lucilla Lame in 2017,  Follow up is due this year. (report is under MEDIA)       Relevant Orders   Ambulatory referral to Gastroenterology   Vitamin D deficiency    Diagnosed in the past ,, with repeat level again low.  Megadose prescribed       Relevant Orders   Vitamin D (25 hydroxy) (Completed)   Other Visit Diagnoses     Encounter for preventive health examination    -  Primary   Relevant Orders   Comprehensive metabolic panel (Completed)   TSH (Completed)   Lipid panel (Completed)   CBC with Differential/Platelet (Completed)   HIV Antibody (routine testing w rflx) (Completed)   Hepatitis C antibody (Completed)   Encounter for screening mammogram for malignant neoplasm of breast       Relevant Orders   MM 3D SCREEN BREAST BILATERAL (Completed)       I have discontinued Judy Blankenship's estradiol, sulfamethoxazole-trimethoprim, and sertraline. I am also having her maintain her progesterone and ibuprofen.  Meds ordered this encounter  Medications   DISCONTD: venlafaxine XR (EFFEXOR-XR) 150 MG 24 hr capsule    Sig: Take 1 capsule (150 mg total) by mouth daily with breakfast.    Dispense:  30 capsule    Refill:  1   DISCONTD: ergocalciferol (DRISDOL) 1.25 MG (50000  UT) capsule    Sig: Take 1 capsule (50,000 Units total) by mouth once a week.    Dispense:  4 capsule    Refill:  0    Medications Discontinued During This Encounter  Medication Reason   sertraline (ZOLOFT) 100 MG tablet    sulfamethoxazole-trimethoprim (BACTRIM DS) 800-160 MG tablet    estradiol (VIVELLE-DOT) 0.075 MG/24HR     Follow-up: Return in about 6 months (around 07/26/2021).   Crecencio Mc, MD

## 2021-01-26 NOTE — Patient Instructions (Signed)
I am changing your  anti depressant to effexor  For one week tale 100 mg zoloft and one effexor  Stop zoloft after one week

## 2021-01-26 NOTE — Assessment & Plan Note (Addendum)
Done by Lucilla Lame in 2017,  Follow up is due this year. (report is under MEDIA)

## 2021-01-27 DIAGNOSIS — E559 Vitamin D deficiency, unspecified: Secondary | ICD-10-CM | POA: Insufficient documentation

## 2021-01-27 LAB — HEPATITIS C ANTIBODY
Hepatitis C Ab: NONREACTIVE
SIGNAL TO CUT-OFF: 0.01 (ref <1.00)

## 2021-01-27 LAB — HIV ANTIBODY (ROUTINE TESTING W REFLEX): HIV 1&2 Ab, 4th Generation: NONREACTIVE

## 2021-01-27 MED ORDER — ERGOCALCIFEROL 1.25 MG (50000 UT) PO CAPS: 50000.0000 [IU] | ORAL_CAPSULE | ORAL | 0 refills | Status: DC

## 2021-01-27 NOTE — Assessment & Plan Note (Signed)
10 yr risk remains < 5%   .  No statin therapy advixed  Lab Results  Component Value Date   CHOL 242 (H) 01/26/2021   HDL 53.00 01/26/2021   LDLCALC 171 (H) 01/26/2021   TRIG 91.0 01/26/2021   CHOLHDL 5 01/26/2021

## 2021-01-27 NOTE — Assessment & Plan Note (Addendum)
Diagnosed in the past ,, with repeat level again low.  Megadose prescribed

## 2021-01-29 NOTE — Assessment & Plan Note (Signed)

## 2021-01-29 NOTE — Assessment & Plan Note (Signed)
Changing from zoloft 200 mg dose  to effexor due to decreased libido. Starting with 150 mg dose

## 2021-02-09 ENCOUNTER — Other Ambulatory Visit: Payer: Self-pay

## 2021-02-09 ENCOUNTER — Ambulatory Visit
Admission: RE | Admit: 2021-02-09 | Discharge: 2021-02-09 | Disposition: A | Discharge status: Home / Self Care | Payer: Managed Care, Other (non HMO) | Source: Ambulatory Visit | Attending: Internal Medicine | Admitting: Internal Medicine

## 2021-02-09 DIAGNOSIS — Z1231 Encounter for screening mammogram for malignant neoplasm of breast: Secondary | ICD-10-CM | POA: Insufficient documentation

## 2021-02-17 ENCOUNTER — Other Ambulatory Visit: Payer: Self-pay | Admitting: Internal Medicine

## 2021-02-21 ENCOUNTER — Other Ambulatory Visit: Payer: Self-pay | Admitting: Internal Medicine

## 2021-03-02 ENCOUNTER — Other Ambulatory Visit: Payer: Self-pay

## 2021-03-02 ENCOUNTER — Telehealth: Payer: Self-pay

## 2021-03-02 DIAGNOSIS — D126 Benign neoplasm of colon, unspecified: Secondary | ICD-10-CM

## 2021-03-02 MED ORDER — NA SULFATE-K SULFATE-MG SULF 17.5-3.13-1.6 GM/177ML PO SOLN: 1.0000 | Freq: Once | ORAL | 0 refills | Status: AC

## 2021-03-02 NOTE — Telephone Encounter (Signed)
Returned patients call. Lvm to call office back.

## 2021-03-02 NOTE — Progress Notes (Signed)
Gastroenterology Pre-Procedure Review  Request Date: 03/24/21 Requesting Physician: Dr. Allen Norris  PATIENT REVIEW QUESTIONS: The patient responded to the following health history questions as indicated:    1. Are you having any GI issues? no 2. Do you have a personal history of Polyps? yes (06/22/2015) 3. Do you have a family history of Colon Cancer or Polyps? Yes, father colon polyps 4. Diabetes Mellitus? no 5. Joint replacements in the past 12 months?no 6. Major health problems in the past 3 months?no 7. Any artificial heart valves, MVP, or defibrillator?no    MEDICATIONS & ALLERGIES:    Patient reports the following regarding taking any anticoagulation/antiplatelet therapy:   Plavix, Coumadin, Eliquis, Xarelto, Lovenox, Pradaxa, Brilinta, or Effient? no Aspirin? no  Patient confirms/reports the following medications:  Current Outpatient Medications  Medication Sig Dispense Refill   ibuprofen (ADVIL) 800 MG tablet Take 1 tablet (800 mg total) by mouth every 8 (eight) hours as needed. (Patient not taking: Reported on 01/26/2021) 30 tablet 0   progesterone (PROMETRIUM) 100 MG capsule Take 1 capsule by mouth daily. (Patient not taking: Reported on 01/26/2021)  3   venlafaxine XR (EFFEXOR-XR) 150 MG 24 hr capsule TAKE 1 CAPSULE BY MOUTH DAILY WITH BREAKFAST. 90 capsule 1   Vitamin D, Ergocalciferol, (DRISDOL) 1.25 MG (50000 UNIT) CAPS capsule TAKE 1 CAPSULE BY MOUTH ONE TIME PER WEEK 4 capsule 0   No current facility-administered medications for this visit.    Patient confirms/reports the following allergies:  No Known Allergies  No orders of the defined types were placed in this encounter.   AUTHORIZATION INFORMATION Primary Insurance: 1D#: Group #:  Secondary Insurance: 1D#: Group #:  SCHEDULE INFORMATION: Date: 03/24/21 Time: Location: [place]

## 2021-03-21 ENCOUNTER — Telehealth: Payer: Self-pay | Admitting: Internal Medicine

## 2021-03-21 ENCOUNTER — Telehealth: Payer: Self-pay

## 2021-03-21 DIAGNOSIS — D126 Benign neoplasm of colon, unspecified: Secondary | ICD-10-CM

## 2021-03-21 NOTE — Telephone Encounter (Signed)
Patient informed Pre-service that she was cancelling her procedure scheduled on 03/24/2021 with Dr. Allen Norris. Clinical Staff will follow up with patient.

## 2021-03-21 NOTE — Telephone Encounter (Signed)
Patient is calling in to see if her referral can be sent to [facility] on [address] in [place] phone number is [phone].Please call her with an update.

## 2021-03-21 NOTE — Telephone Encounter (Signed)
Pt. Calling to reschedule colonoscopy

## 2021-03-22 NOTE — Telephone Encounter (Signed)
Procedure has been cancelled. Patient had requested to go somewhere else. Endo unit has been notified.

## 2021-03-24 ENCOUNTER — Encounter: Admission: RE | Payer: Self-pay | Source: Ambulatory Visit

## 2021-03-24 ENCOUNTER — Ambulatory Visit
Admission: RE | Admit: 2021-03-24 | Payer: Managed Care, Other (non HMO) | Source: Ambulatory Visit | Admitting: Gastroenterology

## 2021-03-24 SURGERY — COLONOSCOPY WITH PROPOFOL
Anesthesia: General

## 2021-07-26 ENCOUNTER — Other Ambulatory Visit: Payer: Self-pay

## 2021-07-26 ENCOUNTER — Encounter: Payer: Self-pay | Admitting: Internal Medicine

## 2021-07-26 ENCOUNTER — Ambulatory Visit (INDEPENDENT_AMBULATORY_CARE_PROVIDER_SITE_OTHER): Payer: Managed Care, Other (non HMO) | Admitting: Internal Medicine

## 2021-07-26 VITALS — BP 108/70 | HR 79 | Temp 97.9°F | Ht 63.75 in | Wt 158.4 lb

## 2021-07-26 DIAGNOSIS — F419 Anxiety disorder, unspecified: Secondary | ICD-10-CM

## 2021-07-26 DIAGNOSIS — R7301 Impaired fasting glucose: Secondary | ICD-10-CM

## 2021-07-26 DIAGNOSIS — E559 Vitamin D deficiency, unspecified: Secondary | ICD-10-CM

## 2021-07-26 DIAGNOSIS — K5909 Other constipation: Secondary | ICD-10-CM

## 2021-07-26 DIAGNOSIS — F32A Depression, unspecified: Secondary | ICD-10-CM

## 2021-07-26 DIAGNOSIS — E785 Hyperlipidemia, unspecified: Secondary | ICD-10-CM

## 2021-07-26 DIAGNOSIS — D126 Benign neoplasm of colon, unspecified: Secondary | ICD-10-CM

## 2021-07-26 DIAGNOSIS — R5383 Other fatigue: Secondary | ICD-10-CM | POA: Diagnosis not present

## 2021-07-26 DIAGNOSIS — E663 Overweight: Secondary | ICD-10-CM

## 2021-07-26 NOTE — Patient Instructions (Addendum)
°  For your constipation:  1) The first course is to increase the water in your morning routine.  Try drinking 16 ounces of room temperature water in the morning to get  things going  2) Increase your fiber in your diet to 25 g daily . Fruit and vegetables are good sources,  Tespecially the berries, cherries and apples.  the [employer] and Atkins protein bars , and the low carb breads   (see below)  are all heavy on  fiber,    You can also take miralax, metamucil, fibercon, or citrucel daily to supplement your fiber. These are gentle and work in 1 to 2 days to relieve constipation, and  you can also combine them daily with colace,  A stool softener .   HERE ARE THE LOW CARB  BREAD CHOICES  THAT ARE HIGHER IN FIBER THAN THE WELL KNOWN BREADS.  The [employer] MADE FROM WHOLE WHEAT HAS 26 G FIBER ! THESE CAN ALLALSO  BE BAKED TO CREATE PITA CHIPS

## 2021-07-26 NOTE — Assessment & Plan Note (Signed)
Continue 2000 Ius daily following use of megadose

## 2021-07-26 NOTE — Assessment & Plan Note (Signed)
I have encouraged her in  reduction of   BMI with goal of 10% of body weight over the next 6 months using a low glycemic index diet and regular exercise a minimum of 5 days per week.

## 2021-07-26 NOTE — Assessment & Plan Note (Signed)
Tolerating change from zoloft to effexor due to decreased libido. Starting with 150 mg dose

## 2021-07-26 NOTE — Progress Notes (Signed)
Subjective:  Patient ID: Judy Blankenship, female    DOB: 1965-10-20  Age: 56 y.o. MRN: [id number]  CC: The primary encounter diagnosis was Vitamin D deficiency. Diagnoses of Hyperlipidemia LDL goal <130, Other fatigue, Impaired fasting glucose, Tubular adenoma of colon, Anxiety and depression, Overweight (BMI 25.0-29.9), and Chronic constipation were also pertinent to this visit.   This visit occurred during the SARS-CoV-2 public health emergency.  Safety protocols were in place, including screening questions prior to the visit, additional usage of staff PPE, and extensive cleaning of exam room while observing appropriate contact time as indicated for disinfecting solutions.    HPI Judy Blankenship presents for follow up on depression, vitamin D deficiency,  and overweight.  Chief Complaint  Patient presents with   Follow-up    6 month follow up     Vitamin D Deficiency; treated with weekly  mega dose,  now on 2000 Ius daily  Depression: secondary to complicated grief following the loss of her mother.  She feels she has been grieving less and feels emotionally more stable and less morose  on Effexor.  Constipation , chronic , using senna gummy bears daily  discussed alternatives to senna  Hot flashes improving .   Mostly in the morning when preparing for work,  and at night .  Caffeine related.  Sleeping better   Overweight ;  weight gain of 8 lbs over the past 1.5 years.  Diet reviewed : healthy,  moderate amoutns of fruit and vegetables .  Taco night and pizza night every week. .  No regular exercise in a while.  No motivation   Outpatient Medications Prior to Visit  Medication Sig Dispense Refill   Cholecalciferol (VITAMIN D) 50 MCG (2000 UT) CAPS Take 2,000 Units by mouth daily.     venlafaxine XR (EFFEXOR-XR) 150 MG 24 hr capsule TAKE 1 CAPSULE BY MOUTH DAILY WITH BREAKFAST. 90 capsule 1   ibuprofen (ADVIL) 800 MG tablet Take 1 tablet (800 mg total) by mouth every 8 (eight) hours as  needed. (Patient not taking: Reported on 01/26/2021) 30 tablet 0   progesterone (PROMETRIUM) 100 MG capsule Take 1 capsule by mouth daily. (Patient not taking: Reported on 01/26/2021)  3   Vitamin D, Ergocalciferol, (DRISDOL) 1.25 MG (50000 UNIT) CAPS capsule TAKE 1 CAPSULE BY MOUTH ONE TIME PER WEEK (Patient not taking: Reported on 07/26/2021) 4 capsule 0   No facility-administered medications prior to visit.    Review of Systems;  Patient denies headache, fevers, malaise, unintentional weight loss, skin rash, eye pain, sinus congestion and sinus pain, sore throat, dysphagia,  hemoptysis , cough, dyspnea, wheezing, chest pain, palpitations, orthopnea, edema, abdominal pain, nausea, melena, diarrhea, constipation, flank pain, dysuria, hematuria, urinary  Frequency, nocturia, numbness, tingling, seizures,  Focal weakness, Loss of consciousness,  Tremor, insomnia, depression, anxiety, and suicidal ideation.      Objective:  BP 108/70 (BP Location: Left Arm, Patient Position: Sitting, Cuff Size: Normal)    Pulse 79    Temp 97.9 F (36.6 C) (Oral)    Ht 5' 3.75" (1.619 m)    Wt 158 lb 6.4 oz (71.8 kg)    LMP 06/08/2017    SpO2 97%    BMI 27.40 kg/m   BP Readings from Last 3 Encounters:  07/26/21 108/70  01/26/21 108/70  08/09/19 107/87    Wt Readings from Last 3 Encounters:  07/26/21 158 lb 6.4 oz (71.8 kg)  01/26/21 157 lb 3.2 oz (71.3 kg)  08/09/19  150 lb (68 kg)    General appearance: alert, cooperative and appears stated age Ears: normal TM's and external ear canals both ears Throat: lips, mucosa, and tongue normal; teeth and gums normal Neck: no adenopathy, no carotid bruit, supple, symmetrical, trachea midline and thyroid not enlarged, symmetric, no tenderness/mass/nodules Back: symmetric, no curvature. ROM normal. No CVA tenderness. Lungs: clear to auscultation bilaterally Heart: regular rate and rhythm, S1, S2 normal, no murmur, click, rub or gallop Abdomen: soft, non-tender;  bowel sounds normal; no masses,  no organomegaly Pulses: 2+ and symmetric Skin: Skin color, texture, turgor normal. No rashes or lesions Lymph nodes: Cervical, supraclavicular, and axillary nodes normal.  No results found for: HGBA1C  Lab Results  Component Value Date   CREATININE 0.66 01/26/2021   CREATININE 0.69 12/10/2018   CREATININE 0.71 11/16/2017    Lab Results  Component Value Date   WBC 5.6 01/26/2021   HGB 12.8 01/26/2021   HCT 38.3 01/26/2021   PLT 275.0 01/26/2021   GLUCOSE 88 01/26/2021   CHOL 242 (H) 01/26/2021   TRIG 91.0 01/26/2021   HDL 53.00 01/26/2021   LDLCALC 171 (H) 01/26/2021   ALT 18 01/26/2021   AST 18 01/26/2021   NA 138 01/26/2021   K 4.1 01/26/2021   CL 103 01/26/2021   CREATININE 0.66 01/26/2021   BUN 16 01/26/2021   CO2 26 01/26/2021   TSH 1.11 01/26/2021    MM 3D SCREEN BREAST BILATERAL  Result Date: 02/14/2021 CLINICAL DATA:  Screening. EXAM: DIGITAL SCREENING BILATERAL MAMMOGRAM WITH TOMOSYNTHESIS AND CAD TECHNIQUE: Bilateral screening digital craniocaudal and mediolateral oblique mammograms were obtained. Bilateral screening digital breast tomosynthesis was performed. The images were evaluated with computer-aided detection. COMPARISON:  Previous exam(s). ACR Breast Density Category c: The breast tissue is heterogeneously dense, which may obscure small masses. FINDINGS: There are no findings suspicious for malignancy. IMPRESSION: No mammographic evidence of malignancy. A result letter of this screening mammogram will be mailed directly to the patient. RECOMMENDATION: Screening mammogram in one year. (Code:SM-B-01Y) BI-RADS CATEGORY  1: Negative. Electronically Signed   By: Dorise Bullion III M.D.   On: 02/14/2021 17:40   Assessment & Plan:   Problem List Items Addressed This Visit     Anxiety and depression    Tolerating change from zoloft to effexor due to decreased libido. Starting with 150 mg dose       Chronic constipation     Reviewed water and fiber needs. .        Hyperlipidemia LDL goal <130   Relevant Orders   Lipid panel   Overweight (BMI 25.0-29.9)    I have encouraged her in  reduction of   BMI with goal of 10% of body weight over the next 6 months using a low glycemic index diet and regular exercise a minimum of 5 days per week.        Tubular adenoma of colon    Done by Lucilla Lame in 2017,  Follow up was scheduled but cancelled and has been rescheduled      Vitamin D deficiency - Primary    Continue 2000 Ius daily following use of megadose       Other Visit Diagnoses     Other fatigue       Relevant Orders   TSH   CBC with Differential/Platelet   Impaired fasting glucose       Relevant Orders   Comprehensive metabolic panel   Hemoglobin A1c  I spent 30 minutes dedicated to the care of this patient on the date of this encounter to include pre-visit review of patient's medical history,  most recent imaging studies, Face-to-face time with the patient , and post visit ordering of testing and therapeutics.    Follow-up: No follow-ups on file.   Crecencio Mc, MD

## 2021-07-26 NOTE — Assessment & Plan Note (Signed)
Reviewed water and fiber needs. Marland Kitchen

## 2021-07-26 NOTE — Assessment & Plan Note (Addendum)
Done by Lucilla Lame in 2017,  Follow up was scheduled but cancelled and has been rescheduled

## 2021-08-14 ENCOUNTER — Other Ambulatory Visit: Payer: Self-pay | Admitting: Internal Medicine

## 2021-09-28 LAB — HM COLONOSCOPY

## 2021-10-12 ENCOUNTER — Other Ambulatory Visit: Payer: Self-pay | Admitting: Internal Medicine

## 2021-10-12 MED ORDER — PREDNISONE 10 MG PO TABS: ORAL_TABLET | ORAL | 0 refills | Status: DC

## 2021-10-13 IMAGING — MG MM DIGITAL SCREENING BILAT W/ TOMO AND CAD
6 of 10 series · 6 of 30 positions shown · non-contrast
Comparison: Previous exam(s).

CLINICAL DATA: Screening.

EXAM:
DIGITAL SCREENING BILATERAL MAMMOGRAM WITH TOMOSYNTHESIS AND CAD
TECHNIQUE: Bilateral screening digital craniocaudal and mediolateral oblique
mammograms were obtained. Bilateral screening digital breast
tomosynthesis was performed. The images were evaluated with
computer-aided detection.

[L MLO synth-2D]
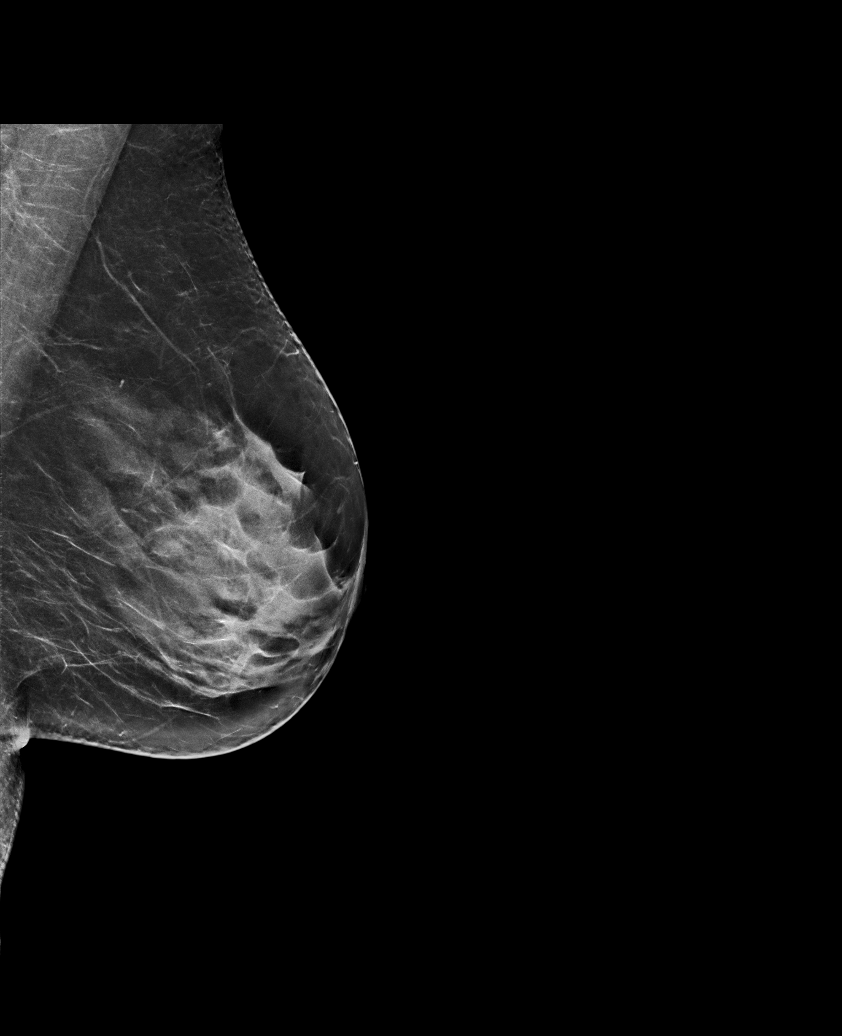

[R MLO synth-2D]
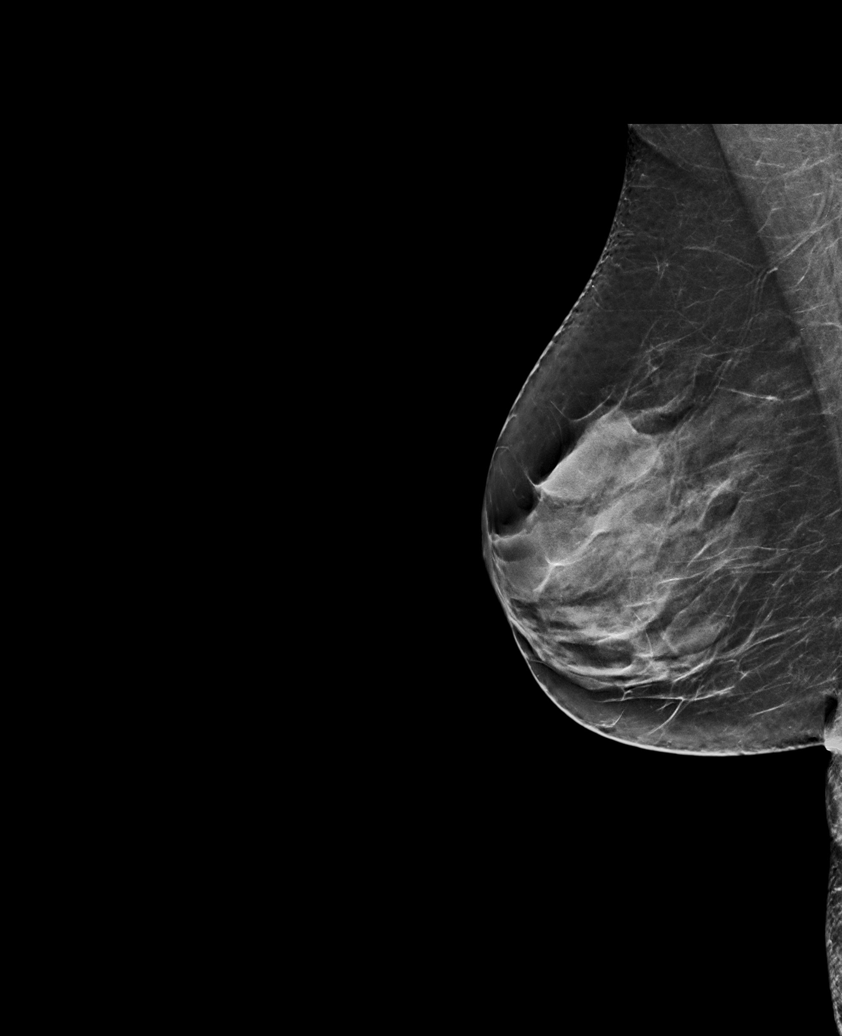

[R CC synth-2D (1 of 2)]
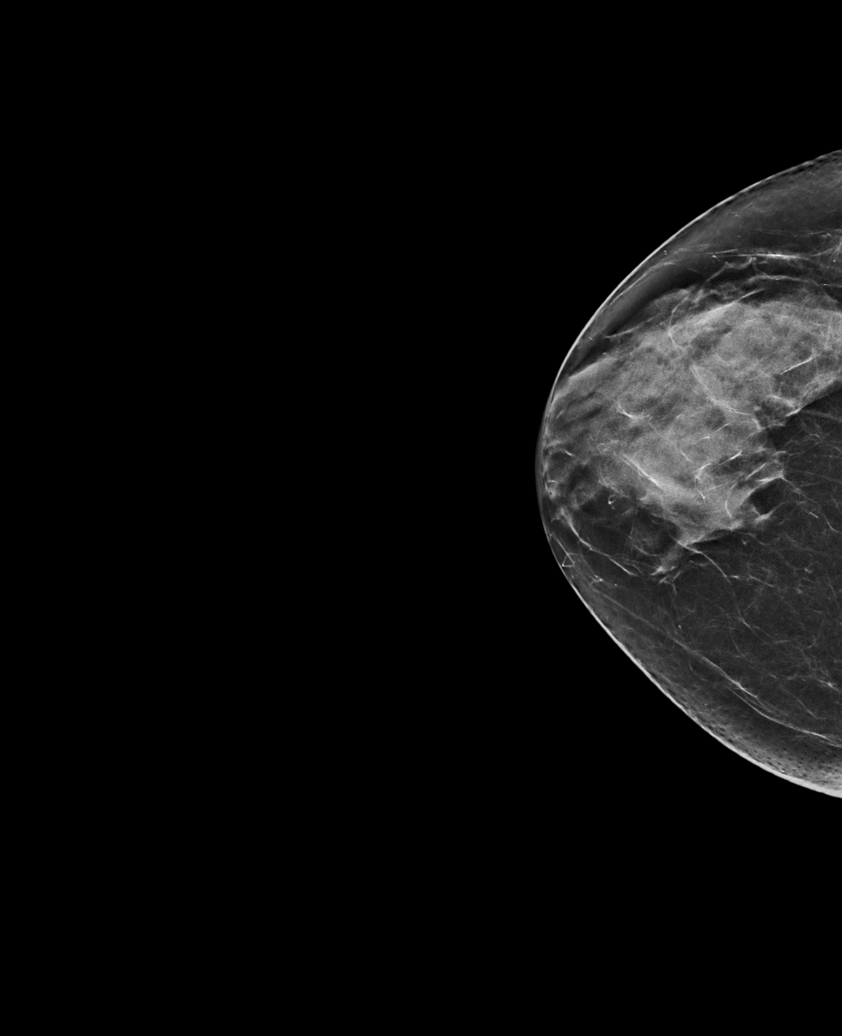

[R CC synth-2D (2 of 2)]
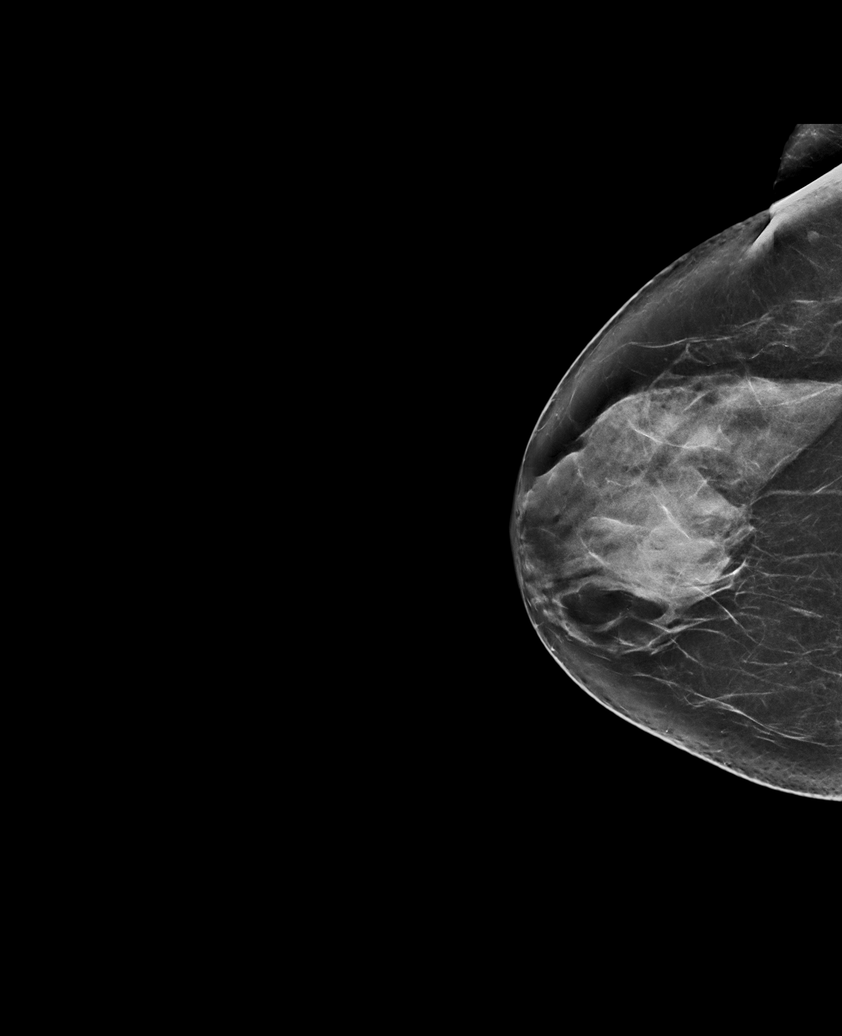

[L CC synth-2D]
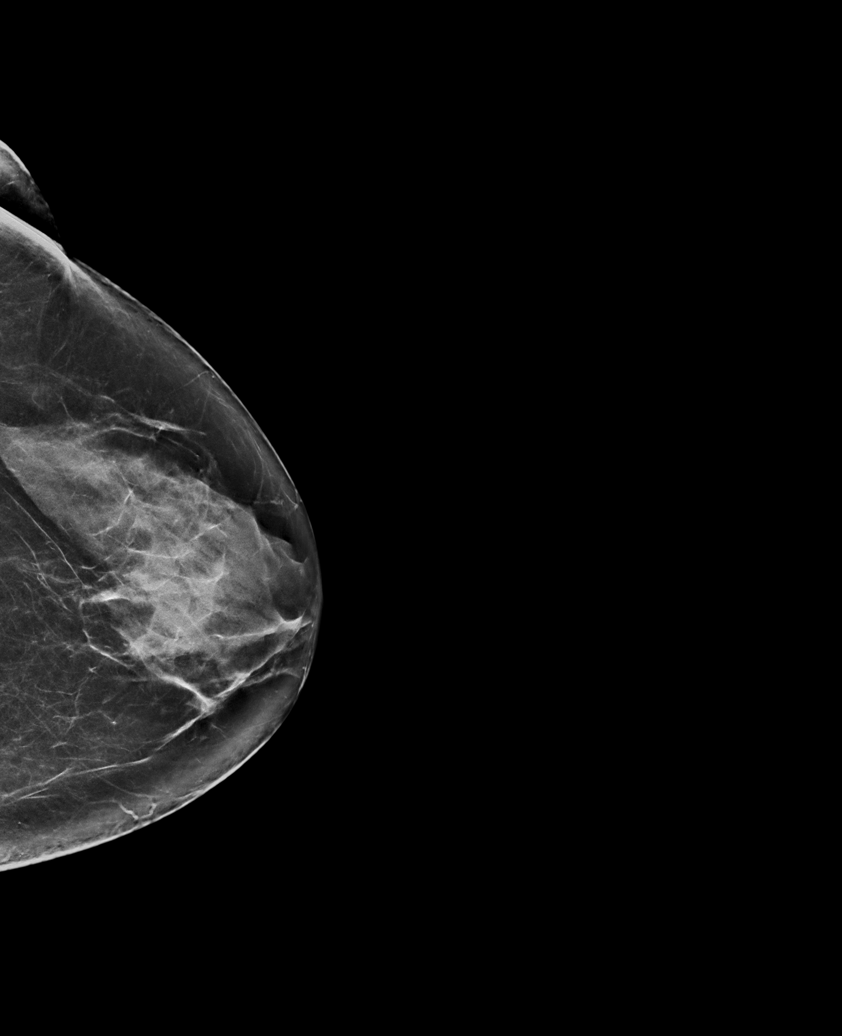

[R MLO tomo · tomo slice 35/68.0]
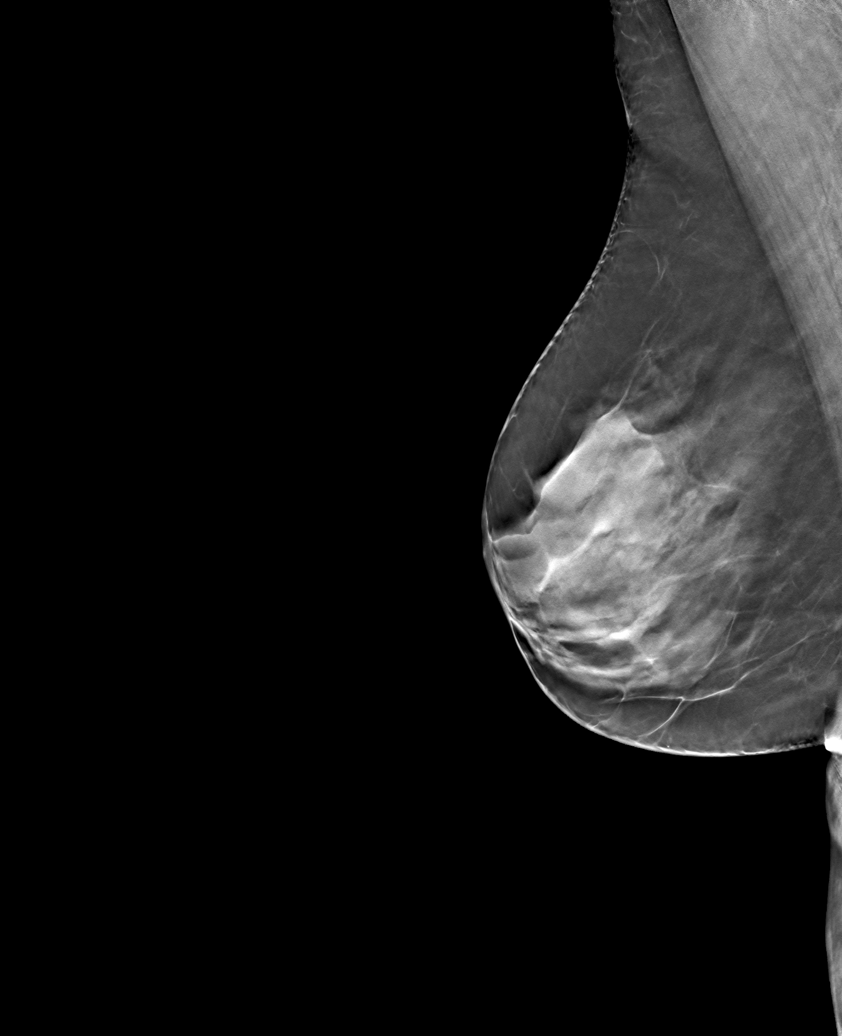

[6 of 30 positions shown; findings below may reference images not displayed]

ACR Breast Density Category c: The breast tissue is heterogeneously
dense, which may obscure small masses.
FINDINGS: There are no findings suspicious for malignancy.
IMPRESSION: No mammographic evidence of malignancy. A result letter of this
screening mammogram will be mailed directly to the patient.

RECOMMENDATION:
Screening mammogram in one year. (Code:Q3-W-BC3)

BI-RADS CATEGORY  1: Negative.

## 2021-11-15 ENCOUNTER — Encounter: Payer: Self-pay | Admitting: Internal Medicine

## 2021-11-15 ENCOUNTER — Other Ambulatory Visit: Payer: Self-pay

## 2021-11-15 MED ORDER — VENLAFAXINE HCL ER 150 MG PO CP24: 150.0000 mg | ORAL_CAPSULE | Freq: Every day | ORAL | 1 refills | Status: DC

## 2022-01-26 ENCOUNTER — Other Ambulatory Visit: Payer: Managed Care, Other (non HMO)

## 2022-02-01 ENCOUNTER — Ambulatory Visit: Payer: Managed Care, Other (non HMO) | Admitting: Internal Medicine

## 2022-02-15 ENCOUNTER — Other Ambulatory Visit (INDEPENDENT_AMBULATORY_CARE_PROVIDER_SITE_OTHER): Payer: Managed Care, Other (non HMO)

## 2022-02-15 DIAGNOSIS — R7301 Impaired fasting glucose: Secondary | ICD-10-CM

## 2022-02-15 DIAGNOSIS — R5383 Other fatigue: Secondary | ICD-10-CM | POA: Diagnosis not present

## 2022-02-15 DIAGNOSIS — E785 Hyperlipidemia, unspecified: Secondary | ICD-10-CM

## 2022-02-15 LAB — LIPID PANEL
Cholesterol: 223 mg/dL — ABNORMAL HIGH (ref 0–200)
HDL: 48.7 mg/dL (ref >39.00)
LDL Cholesterol: 150 mg/dL — ABNORMAL HIGH (ref 0–99)
NonHDL: 174.41
Total CHOL/HDL Ratio: 5
Triglycerides: 122 mg/dL (ref 0.0–149.0)
VLDL: 24.4 mg/dL (ref 0.0–40.0)

## 2022-02-15 LAB — COMPREHENSIVE METABOLIC PANEL
ALT: 26 U/L (ref 0–35)
AST: 21 U/L (ref 0–37)
Albumin: 4.1 g/dL (ref 3.5–5.2)
Alkaline Phosphatase: 65 U/L (ref 39–117)
BUN: 14 mg/dL (ref 6–23)
CO2: 28 mEq/L (ref 19–32)
Calcium: 9.5 mg/dL (ref 8.4–10.5)
Chloride: 103 mEq/L (ref 96–112)
Creatinine, Ser: 0.76 mg/dL (ref 0.40–1.20)
GFR: 87.03 mL/min (ref >60.00)
Glucose, Bld: 93 mg/dL (ref 70–99)
Potassium: 3.9 mEq/L (ref 3.5–5.1)
Sodium: 139 mEq/L (ref 135–145)
Total Bilirubin: 0.6 mg/dL (ref 0.2–1.2)
Total Protein: 6.8 g/dL (ref 6.0–8.3)

## 2022-02-15 LAB — TSH: TSH: 1.78 u[IU]/mL (ref 0.35–5.50)

## 2022-02-15 LAB — CBC WITH DIFFERENTIAL/PLATELET
Basophils Absolute: 0 10*3/uL (ref 0.0–0.1)
Basophils Relative: 0.7 % (ref 0.0–3.0)
Eosinophils Absolute: 0.2 10*3/uL (ref 0.0–0.7)
Eosinophils Relative: 3.4 % (ref 0.0–5.0)
HCT: 37.9 % (ref 36.0–46.0)
Hemoglobin: 12.9 g/dL (ref 12.0–15.0)
Lymphocytes Relative: 33.9 % (ref 12.0–46.0)
Lymphs Abs: 1.9 10*3/uL (ref 0.7–4.0)
MCHC: 34.1 g/dL (ref 30.0–36.0)
MCV: 89.3 fl (ref 78.0–100.0)
Monocytes Absolute: 0.5 10*3/uL (ref 0.1–1.0)
Monocytes Relative: 8.9 % (ref 3.0–12.0)
Neutro Abs: 3 10*3/uL (ref 1.4–7.7)
Neutrophils Relative %: 53.1 % (ref 43.0–77.0)
Platelets: 298 10*3/uL (ref 150.0–400.0)
RBC: 4.25 Mil/uL (ref 3.87–5.11)
RDW: 12.6 % (ref 11.5–15.5)
WBC: 5.7 10*3/uL (ref 4.0–10.5)

## 2022-02-15 LAB — HEMOGLOBIN A1C: Hgb A1c MFr Bld: 5.8 % (ref 4.6–6.5)

## 2022-02-17 ENCOUNTER — Ambulatory Visit (INDEPENDENT_AMBULATORY_CARE_PROVIDER_SITE_OTHER): Payer: Managed Care, Other (non HMO) | Admitting: Internal Medicine

## 2022-02-17 ENCOUNTER — Encounter: Payer: Self-pay | Admitting: Internal Medicine

## 2022-02-17 VITALS — BP 108/58 | HR 86 | Temp 98.2°F | Ht 63.75 in | Wt 163.2 lb

## 2022-02-17 DIAGNOSIS — R350 Frequency of micturition: Secondary | ICD-10-CM | POA: Diagnosis not present

## 2022-02-17 DIAGNOSIS — Z23 Encounter for immunization: Secondary | ICD-10-CM | POA: Diagnosis not present

## 2022-02-17 DIAGNOSIS — Z1231 Encounter for screening mammogram for malignant neoplasm of breast: Secondary | ICD-10-CM

## 2022-02-17 DIAGNOSIS — Z0000 Encounter for general adult medical examination without abnormal findings: Secondary | ICD-10-CM

## 2022-02-17 LAB — URINALYSIS, ROUTINE W REFLEX MICROSCOPIC
Bilirubin Urine: NEGATIVE
Hgb urine dipstick: NEGATIVE
Ketones, ur: NEGATIVE
Leukocytes,Ua: NEGATIVE
Nitrite: NEGATIVE
RBC / HPF: NONE SEEN (ref 0–?)
Specific Gravity, Urine: 1.02 (ref 1.000–1.030)
Total Protein, Urine: NEGATIVE
Urine Glucose: NEGATIVE
Urobilinogen, UA: 0.2 (ref 0.0–1.0)
WBC, UA: NONE SEEN (ref 0–?)
pH: 6 (ref 5.0–8.0)

## 2022-02-17 NOTE — Assessment & Plan Note (Signed)
age appropriate education and counseling updated, referrals for preventative services and immunizations addressed, dietary and smoking counseling addressed, most recent labs reviewed.  I have personally reviewed and have noted:    1) the patient's medical and social history 2) The pt's use of alcohol, tobacco, and illicit drugs 3) The patient's current medications and supplements 4) Functional ability including ADL's, fall risk, home safety risk, hearing and visual impairment 5) Diet and physical activities 6) Evidence for depression or mood disorder 7) The patient's height, weight, and BMI have been recorded in the chart 9) 10 yr risk of CAD using the AHA calculator and current lipid panel is 2%.  I have made referrals, and provided counseling and education based on review of the above

## 2022-02-17 NOTE — Progress Notes (Unsigned)
The patient is here for annual preventive examination and management of other chronic and acute problems.   The risk factors are reflected in the social history.   The roster of all physicians providing medical care to patient - is listed in the Snapshot section of the chart.   Activities of daily living:  The patient is 100% independent in all ADLs: dressing, toileting, feeding as well as independent mobility   Home safety : The patient has smoke detectors in the home. They wear seatbelts.  There are no unsecured firearms at home. There is no violence in the home.    There is no risks for hepatitis, STDs or HIV. There is no   history of blood transfusion. They have no travel history to infectious disease endemic areas of the world.   The patient has seen their dentist in the last six month. They have seen their eye doctor in the last year. The patinet  denies slight hearing difficulty with regard to whispered voices and some television programs.  They have deferred audiologic testing in the last year.  They do not  have excessive sun exposure. Discussed the need for sun protection: hats, long sleeves and use of sunscreen if there is significant sun exposure.    Diet: the importance of a healthy diet is discussed. They do have a healthy diet.   The benefits of regular aerobic exercise were discussed. The patient  exercises  3 to 5 days per week  for  60 minutes.    Depression screen: there are no signs or vegative symptoms of depression- irritability, change in appetite, anhedonia, sadness/tearfullness.   The following portions of the patient's history were reviewed and updated as appropriate: allergies, current medications, past family history, past medical history,  past surgical history, past social history  and problem list.   Visual acuity was not assessed per patient preference since the patient has regular follow up with an  ophthalmologist. Hearing and body mass index were assessed and  reviewed.    During the course of the visit the patient was educated and counseled about appropriate screening and preventive services including : fall prevention , diabetes screening, nutrition counseling, colorectal cancer screening, and recommended immunizations.    Chief Complaint:  none     Review of Symptoms  Patient denies headache, fevers, malaise, unintentional weight loss, skin rash, eye pain, sinus congestion and sinus pain, sore throat, dysphagia,  hemoptysis , cough, dyspnea, wheezing, chest pain, palpitations, orthopnea, edema, abdominal pain, nausea, melena, diarrhea, constipation, flank pain, dysuria, hematuria, urinary  Frequency, nocturia, numbness, tingling, seizures,  Focal weakness, Loss of consciousness,  Tremor, insomnia, depression, anxiety, and suicidal ideation.    Physical Exam:  BP (!) 108/58 (BP Location: Left Arm, Patient Position: Sitting, Cuff Size: Normal)   Pulse 86   Temp 98.2 F (36.8 C) (Oral)   Ht 5' 3.75" (1.619 m)   Wt 163 lb 3.2 oz (74 kg)   LMP 06/08/2017   SpO2 97%   BMI 28.23 kg/m    General appearance: alert, cooperative and appears stated age Head: Normocephalic, without obvious abnormality, atraumatic Eyes: conjunctivae/corneas clear. PERRL, EOM's intact. Fundi benign. Ears: normal TM's and external ear canals both ears Nose: Nares normal. Septum midline. Mucosa normal. No drainage or sinus tenderness. Throat: lips, mucosa, and tongue normal; teeth and gums normal Neck: no adenopathy, no carotid bruit, no JVD, supple, symmetrical, trachea midline and thyroid not enlarged, symmetric, no tenderness/mass/nodules Lungs: clear to auscultation bilaterally Breasts: normal appearance,  no masses or tenderness Heart: regular rate and rhythm, S1, S2 normal, no murmur, click, rub or gallop Abdomen: soft, non-tender; bowel sounds normal; no masses,  no organomegaly Extremities: extremities normal, atraumatic, no cyanosis or edema Pulses: 2+ and  symmetric Skin: Skin color, texture, turgor normal. No rashes or lesions Neurologic: Alert and oriented X 3, normal strength and tone. Normal symmetric reflexes. Normal coordination and gait.     Assessment and Plan:  No problem-specific Assessment & Plan notes found for this encounter.   Updated Medication List Outpatient Encounter Medications as of 02/17/2022  Medication Sig   Cholecalciferol (VITAMIN D) 50 MCG (2000 UT) CAPS Take 2,000 Units by mouth daily.   venlafaxine XR (EFFEXOR-XR) 150 MG 24 hr capsule Take 1 capsule (150 mg total) by mouth daily with breakfast.   [DISCONTINUED] predniSONE (DELTASONE) 10 MG tablet 6 tablets on Day 1 , then reduce by 1 tablet daily until gone   No facility-administered encounter medications on file as of 02/17/2022.

## 2022-02-18 LAB — URINE CULTURE
MICRO NUMBER:: 13956018
SPECIMEN QUALITY:: ADEQUATE

## 2022-03-29 ENCOUNTER — Ambulatory Visit
Admission: RE | Admit: 2022-03-29 | Discharge: 2022-03-29 | Disposition: A | Discharge status: Home / Self Care | Payer: Managed Care, Other (non HMO) | Source: Ambulatory Visit | Attending: Internal Medicine | Admitting: Internal Medicine

## 2022-03-29 DIAGNOSIS — Z1231 Encounter for screening mammogram for malignant neoplasm of breast: Secondary | ICD-10-CM | POA: Insufficient documentation

## 2022-05-15 ENCOUNTER — Other Ambulatory Visit: Payer: Self-pay | Admitting: Internal Medicine

## 2022-05-15 MED ORDER — ELETRIPTAN HYDROBROMIDE 40 MG PO TABS: 40.0000 mg | ORAL_TABLET | ORAL | 1 refills | Status: DC | PRN

## 2022-05-18 ENCOUNTER — Other Ambulatory Visit: Payer: Self-pay | Admitting: Internal Medicine

## 2022-09-21 ENCOUNTER — Telehealth: Payer: Self-pay | Admitting: Internal Medicine

## 2022-09-21 ENCOUNTER — Encounter: Payer: Self-pay | Admitting: Internal Medicine

## 2022-09-21 DIAGNOSIS — R7301 Impaired fasting glucose: Secondary | ICD-10-CM

## 2022-09-21 DIAGNOSIS — E785 Hyperlipidemia, unspecified: Secondary | ICD-10-CM

## 2022-09-21 NOTE — Telephone Encounter (Signed)
Spoke with pt and she stated that she went to her eye doctor, he stated that her vision has doubled since last year. He would like for her to be evaluated for hypertension and diabetes to make sure that is not the cause of the vision change. I have ordered labs for pt to have done tomorrow and scheduled her a follow up appt with you next week.

## 2022-09-21 NOTE — Telephone Encounter (Signed)
Pt called in staying that she went to her eye doctor, and provider told her that she should check her blood sugar. I was checking for an appt for her but the next one its at the end of May. As per pt, she can't wait that long to see Tullo. As per pt, what should she do? She would like a phone call regarding this matter.Marland Kitchen

## 2022-09-21 NOTE — Telephone Encounter (Signed)
noted 

## 2022-09-22 ENCOUNTER — Other Ambulatory Visit (INDEPENDENT_AMBULATORY_CARE_PROVIDER_SITE_OTHER): Payer: Managed Care, Other (non HMO)

## 2022-09-22 DIAGNOSIS — E785 Hyperlipidemia, unspecified: Secondary | ICD-10-CM | POA: Diagnosis not present

## 2022-09-22 DIAGNOSIS — R7301 Impaired fasting glucose: Secondary | ICD-10-CM | POA: Diagnosis not present

## 2022-09-22 LAB — LIPID PANEL
Cholesterol: 236 mg/dL — ABNORMAL HIGH (ref 0–200)
HDL: 53.8 mg/dL (ref >39.00)
LDL Cholesterol: 161 mg/dL — ABNORMAL HIGH (ref 0–99)
NonHDL: 182
Total CHOL/HDL Ratio: 4
Triglycerides: 104 mg/dL (ref 0.0–149.0)
VLDL: 20.8 mg/dL (ref 0.0–40.0)

## 2022-09-22 LAB — COMPREHENSIVE METABOLIC PANEL
ALT: 17 U/L (ref 0–35)
AST: 16 U/L (ref 0–37)
Albumin: 4.3 g/dL (ref 3.5–5.2)
Alkaline Phosphatase: 67 U/L (ref 39–117)
BUN: 15 mg/dL (ref 6–23)
CO2: 28 mEq/L (ref 19–32)
Calcium: 9.3 mg/dL (ref 8.4–10.5)
Chloride: 102 mEq/L (ref 96–112)
Creatinine, Ser: 0.71 mg/dL (ref 0.40–1.20)
GFR: 94.04 mL/min (ref >60.00)
Glucose, Bld: 97 mg/dL (ref 70–99)
Potassium: 4 mEq/L (ref 3.5–5.1)
Sodium: 140 mEq/L (ref 135–145)
Total Bilirubin: 0.4 mg/dL (ref 0.2–1.2)
Total Protein: 7.2 g/dL (ref 6.0–8.3)

## 2022-09-22 LAB — LDL CHOLESTEROL, DIRECT: Direct LDL: 170 mg/dL

## 2022-09-22 LAB — MICROALBUMIN / CREATININE URINE RATIO
Creatinine,U: 203.8 mg/dL
Microalb Creat Ratio: 0.5 mg/g (ref 0.0–30.0)
Microalb, Ur: 1 mg/dL (ref 0.0–1.9)

## 2022-09-22 LAB — HEMOGLOBIN A1C: Hgb A1c MFr Bld: 5.7 % (ref 4.6–6.5)

## 2022-09-26 ENCOUNTER — Ambulatory Visit: Payer: Managed Care, Other (non HMO) | Admitting: Internal Medicine

## 2022-09-26 ENCOUNTER — Encounter: Payer: Self-pay | Admitting: Internal Medicine

## 2022-09-26 VITALS — BP 120/76 | HR 94 | Temp 98.5°F | Ht 63.75 in | Wt 169.8 lb

## 2022-09-26 DIAGNOSIS — E785 Hyperlipidemia, unspecified: Secondary | ICD-10-CM

## 2022-09-26 DIAGNOSIS — F419 Anxiety disorder, unspecified: Secondary | ICD-10-CM | POA: Diagnosis not present

## 2022-09-26 DIAGNOSIS — D126 Benign neoplasm of colon, unspecified: Secondary | ICD-10-CM | POA: Diagnosis not present

## 2022-09-26 DIAGNOSIS — F32A Depression, unspecified: Secondary | ICD-10-CM

## 2022-09-26 DIAGNOSIS — H539 Unspecified visual disturbance: Secondary | ICD-10-CM

## 2022-09-26 MED ORDER — ELETRIPTAN HYDROBROMIDE 40 MG PO TABS: 40.0000 mg | ORAL_TABLET | ORAL | 1 refills | Status: DC | PRN

## 2022-09-26 MED ORDER — VENLAFAXINE HCL ER 150 MG PO CP24: 150.0000 mg | ORAL_CAPSULE | Freq: Every day | ORAL | 1 refills | Status: DC

## 2022-09-26 NOTE — Assessment & Plan Note (Signed)
Visual acuity has changed significantly; she has a history of congenital cataracts.  Screening for DM, hypertension and hyperlipidemia has been repeated at request of optometry and negative

## 2022-09-26 NOTE — Progress Notes (Signed)
Subjective:  Patient ID: Judy Blankenship, female    DOB: 1966/01/04  Age: 57 y.o. MRN: [id number]  CC: The primary encounter diagnosis was Anxiety and depression. Diagnoses of Hyperlipidemia LDL goal <130, Tubular adenoma of colon, and Change in vision were also pertinent to this visit.   HPI Judy Blankenship presents for  Chief Complaint  Patient presents with   Medical Management of Chronic Issues    Follow up from eye doctor   57 yr old female here for follow up on labs recently done to screen for hypertension , hyperlipidemia and diabetes due to recent significant change in vision during annual optometry exam.  All labs were non diagnostic and review of past and present VS are normal with no evidence of HTN   History of migraine:  has not had any recently   History of GAD/depression :  symptoms are managed well with effexor XR in spiite of multiple family stressors.     Outpatient Medications Prior to Visit  Medication Sig Dispense Refill   eletriptan (RELPAX) 40 MG tablet Take 1 tablet (40 mg total) by mouth as needed. 10 tablet 1   venlafaxine XR (EFFEXOR-XR) 150 MG 24 hr capsule TAKE 1 CAPSULE BY MOUTH DAILY WITH BREAKFAST. 90 capsule 1   Cholecalciferol (VITAMIN D) 50 MCG (2000 UT) CAPS Take 2,000 Units by mouth daily.     No facility-administered medications prior to visit.    Review of Systems;  Patient denies headache, fevers, malaise, unintentional weight loss, skin rash, eye pain, sinus congestion and sinus pain, sore throat, dysphagia,  hemoptysis , cough, dyspnea, wheezing, chest pain, palpitations, orthopnea, edema, abdominal pain, nausea, melena, diarrhea, constipation, flank pain, dysuria, hematuria, urinary  Frequency, nocturia, numbness, tingling, seizures,  Focal weakness, Loss of consciousness,  Tremor, insomnia, depression, anxiety, and suicidal ideation.      Objective:  BP 120/76   Pulse 94   Temp 98.5 F (36.9 C) (Oral)   Ht 5' 3.75" (1.619 m)   Wt 169  lb 12.8 oz (77 kg)   LMP 06/08/2017   SpO2 94%   BMI 29.38 kg/m   BP Readings from Last 3 Encounters:  09/26/22 120/76  02/17/22 (!) 108/58  07/26/21 108/70    Wt Readings from Last 3 Encounters:  09/26/22 169 lb 12.8 oz (77 kg)  02/17/22 163 lb 3.2 oz (74 kg)  07/26/21 158 lb 6.4 oz (71.8 kg)    Physical Exam Vitals reviewed.  Constitutional:      General: She is not in acute distress.    Appearance: Normal appearance. She is normal weight. She is not ill-appearing, toxic-appearing or diaphoretic.  HENT:     Head: Normocephalic.  Eyes:     General: No scleral icterus.       Right eye: No discharge.        Left eye: No discharge.     Conjunctiva/sclera: Conjunctivae normal.  Cardiovascular:     Rate and Rhythm: Normal rate and regular rhythm.     Heart sounds: Normal heart sounds.  Pulmonary:     Effort: Pulmonary effort is normal. No respiratory distress.     Breath sounds: Normal breath sounds.  Musculoskeletal:        General: Normal range of motion.  Skin:    General: Skin is warm and dry.  Neurological:     General: No focal deficit present.     Mental Status: She is alert and oriented to person, place, and time. Mental status  is at baseline.  Psychiatric:        Mood and Affect: Mood normal.        Behavior: Behavior normal.        Thought Content: Thought content normal.        Judgment: Judgment normal.    Lab Results  Component Value Date   HGBA1C 5.7 09/22/2022   HGBA1C 5.8 02/15/2022    Lab Results  Component Value Date   CREATININE 0.71 09/22/2022   CREATININE 0.76 02/15/2022   CREATININE 0.66 01/26/2021    Lab Results  Component Value Date   WBC 5.7 02/15/2022   HGB 12.9 02/15/2022   HCT 37.9 02/15/2022   PLT 298.0 02/15/2022   GLUCOSE 97 09/22/2022   CHOL 236 (H) 09/22/2022   TRIG 104.0 09/22/2022   HDL 53.80 09/22/2022   LDLDIRECT 170.0 09/22/2022   LDLCALC 161 (H) 09/22/2022   ALT 17 09/22/2022   AST 16 09/22/2022   NA 140  09/22/2022   K 4.0 09/22/2022   CL 102 09/22/2022   CREATININE 0.71 09/22/2022   BUN 15 09/22/2022   CO2 28 09/22/2022   TSH 1.78 02/15/2022   HGBA1C 5.7 09/22/2022   MICROALBUR 1.0 09/22/2022    MM 3D SCREEN BREAST BILATERAL  Result Date: 03/30/2022 CLINICAL DATA:  Screening. EXAM: DIGITAL SCREENING BILATERAL MAMMOGRAM WITH TOMOSYNTHESIS AND CAD TECHNIQUE: Bilateral screening digital craniocaudal and mediolateral oblique mammograms were obtained. Bilateral screening digital breast tomosynthesis was performed. The images were evaluated with computer-aided detection. COMPARISON:  Previous exam(s). ACR Breast Density Category c: The breast tissue is heterogeneously dense, which may obscure small masses. FINDINGS: There are no findings suspicious for malignancy. IMPRESSION: No mammographic evidence of malignancy. A result letter of this screening mammogram will be mailed directly to the patient. RECOMMENDATION: Screening mammogram in one year. (Code:SM-B-01Y) BI-RADS CATEGORY  1: Negative. Electronically Signed   By: Norva Pavlov M.D.   On: 03/30/2022 15:40    Assessment & Plan:  .Anxiety and depression Assessment & Plan: Improved with change to  effexor due to decreased libido. No changes today    Hyperlipidemia LDL goal <130 Assessment & Plan: 10 yr risk remains < 5%   .  No statin therapy advixed  Lab Results  Component Value Date   CHOL 236 (H) 09/22/2022   HDL 53.80 09/22/2022   LDLCALC 161 (H) 09/22/2022   LDLDIRECT 170.0 09/22/2022   TRIG 104.0 09/22/2022   CHOLHDL 4 09/22/2022      Tubular adenoma of colon Assessment & Plan: Her colonoscopy in 2023 was incomplete due to incomplete clearance of bowel  contents despite using the prep;  she deferred the recommended 6 month follow up and is considering follow up now., which I have encouraged    Change in vision Assessment & Plan: Visual acuity has changed significantly; she has a history of congenital cataracts.   Screening for DM, hypertension and hyperlipidemia has been repeated at request of optometry and negative    Other orders -     Eletriptan Hydrobromide; Take 1 tablet (40 mg total) by mouth as needed.  Dispense: 10 tablet; Refill: 1 -     Venlafaxine HCl ER; Take 1 capsule (150 mg total) by mouth daily with breakfast.  Dispense: 90 capsule; Refill: 1     I provided 33 minutes of face-to-face time during this encounter reviewing patient's last visit with me, patient's  most recent surgical and non surgical procedures, previous  labs and imaging studies, counseling on currently  addressed issues,  and post visit ordering to diagnostics and therapeutics .   Follow-up: No follow-ups on file.   Sherlene Shams, MD

## 2022-09-26 NOTE — Assessment & Plan Note (Signed)
Improved with change to  effexor due to decreased libido. No changes today

## 2022-09-26 NOTE — Assessment & Plan Note (Signed)
Her colonoscopy in 2023 was incomplete due to incomplete clearance of bowel  contents despite using the prep;  she deferred the recommended 6 month follow up and is considering follow up now., which I have encouraged

## 2022-09-26 NOTE — Patient Instructions (Signed)
YOU are healthy!   No diabetes,  etcc   Call the [facility]/Dr Cassis and find out HOW the colonoscopy will be billed:  screening vs diagnostic?

## 2022-09-26 NOTE — Assessment & Plan Note (Signed)
10 yr risk remains < 5%   .  No statin therapy advixed  Lab Results  Component Value Date   CHOL 236 (H) 09/22/2022   HDL 53.80 09/22/2022   LDLCALC 161 (H) 09/22/2022   LDLDIRECT 170.0 09/22/2022   TRIG 104.0 09/22/2022   CHOLHDL 4 09/22/2022

## 2023-01-01 ENCOUNTER — Other Ambulatory Visit: Payer: Self-pay | Admitting: Internal Medicine

## 2023-01-25 ENCOUNTER — Encounter: Payer: Self-pay | Admitting: Internal Medicine

## 2023-01-25 NOTE — Telephone Encounter (Signed)
Completed and placed in folder for signature

## 2023-01-31 ENCOUNTER — Ambulatory Visit: Payer: Managed Care, Other (non HMO) | Admitting: Internal Medicine

## 2023-01-31 NOTE — Telephone Encounter (Signed)
Judy Blankenship did you happen to fax this form yesterday?

## 2023-02-19 ENCOUNTER — Encounter: Payer: Managed Care, Other (non HMO) | Admitting: Internal Medicine

## 2023-03-14 ENCOUNTER — Encounter: Payer: Self-pay | Admitting: Internal Medicine

## 2023-03-14 ENCOUNTER — Ambulatory Visit: Payer: Managed Care, Other (non HMO) | Admitting: Internal Medicine

## 2023-03-14 VITALS — BP 120/82 | HR 87 | Ht 63.75 in | Wt 172.6 lb

## 2023-03-14 DIAGNOSIS — Z0000 Encounter for general adult medical examination without abnormal findings: Secondary | ICD-10-CM | POA: Diagnosis not present

## 2023-03-14 DIAGNOSIS — E785 Hyperlipidemia, unspecified: Secondary | ICD-10-CM

## 2023-03-14 DIAGNOSIS — Z1231 Encounter for screening mammogram for malignant neoplasm of breast: Secondary | ICD-10-CM

## 2023-03-14 DIAGNOSIS — Z23 Encounter for immunization: Secondary | ICD-10-CM

## 2023-03-14 MED ORDER — VENLAFAXINE HCL ER 150 MG PO CP24: 150.0000 mg | ORAL_CAPSULE | Freq: Every day | ORAL | 1 refills | Status: DC

## 2023-03-14 NOTE — Assessment & Plan Note (Signed)

## 2023-03-14 NOTE — Assessment & Plan Note (Signed)
10 yr risk remains < 5%   .  No statin therapy advixed  Lab Results  Component Value Date   CHOL 236 (H) 09/22/2022   HDL 53.80 09/22/2022   LDLCALC 161 (H) 09/22/2022   LDLDIRECT 170.0 09/22/2022   TRIG 104.0 09/22/2022   CHOLHDL 4 09/22/2022

## 2023-03-14 NOTE — Patient Instructions (Addendum)
YOU can schedule an RN VISIT for shingrix vaccine #1  on a Friday .  2nd dose is due 2 to 6 months after the   1st one   PLEASE LET ME KNOW WHERE THE COLONOSCOPY REFERRAL NEEDS TO BE SENT  SINCE YOU HAVE MET YOUR DEDUCTIBLE FOR THE YEAR

## 2023-03-14 NOTE — Progress Notes (Signed)
Patient ID: Judy Blankenship, female    DOB: September 06, 1965  Age: 57 y.o. MRN: [id number]  The patient is here for annual preventive examination and management of other chronic and acute problems.   The risk factors are reflected in the social history.   The roster of all physicians providing medical care to patient - is listed in the Snapshot section of the chart.   Activities of daily living:  The patient is 100% independent in all ADLs: dressing, toileting, feeding as well as independent mobility   Home safety : The patient has smoke detectors in the home. They wear seatbelts.  There are no unsecured firearms at home. There is no violence in the home.    There is no risks for hepatitis, STDs or HIV. There is no   history of blood transfusion. They have no travel history to infectious disease endemic areas of the world.   The patient has seen their dentist in the last six month. They have seen their eye doctor in the last year. The patinet  denies slight hearing difficulty with regard to whispered voices and some television programs.  They have deferred audiologic testing in the last year.  They do not  have excessive sun exposure. Discussed the need for sun protection: hats, long sleeves and use of sunscreen if there is significant sun exposure.    Diet: the importance of a healthy diet is discussed. They do have a healthy diet.   The benefits of regular aerobic exercise were discussed. The patient  exercises  3 to 5 days per week  for  60 minutes.    Depression screen: there are no signs or vegative symptoms of depression- irritability, change in appetite, anhedonia, sadness/tearfullness.   The following portions of the patient's history were reviewed and updated as appropriate: allergies, current medications, past family history, past medical history,  past surgical history, past social history  and problem list.   Visual acuity was not assessed per patient preference since the patient has  regular follow up with an  ophthalmologist. Hearing and body mass index were assessed and reviewed.    During the course of the visit the patient was educated and counseled about appropriate screening and preventive services including : fall prevention , diabetes screening, nutrition counseling, colorectal cancer screening, and recommended immunizations.    Chief Complaint:   none    Review of Symptoms  Patient denies headache, fevers, malaise, unintentional weight loss, skin rash, eye pain, sinus congestion and sinus pain, sore throat, dysphagia,  hemoptysis , cough, dyspnea, wheezing, chest pain, palpitations, orthopnea, edema, abdominal pain, nausea, melena, diarrhea, constipation, flank pain, dysuria, hematuria, urinary  Frequency, nocturia, numbness, tingling, seizures,  Focal weakness, Loss of consciousness,  Tremor, insomnia, depression, anxiety, and suicidal ideation.    Physical Exam:  BP 120/82   Pulse 87   Ht 5' 3.75" (1.619 m)   Wt 172 lb 9.6 oz (78.3 kg)   LMP 06/08/2017   SpO2 96%   BMI 29.86 kg/m    Physical Exam  Assessment and Plan: Encounter for screening mammogram for malignant neoplasm of breast -     3D Screening Mammogram, Left and Right; Future  Need for influenza vaccination -     Flu vaccine trivalent PF, 6mos and older(Flulaval,Afluria,Fluarix,Fluzone)  Encounter for preventive health examination Assessment & Plan: age appropriate education and counseling updated, referrals for preventative services and immunizations addressed, dietary and smoking counseling addressed, most recent labs reviewed.  I have personally reviewed and  have noted:   1) the patient's medical and social history 2) The pt's use of alcohol, tobacco, and illicit drugs 3) The patient's current medications and supplements 4) Functional ability including ADL's, fall risk, home safety risk, hearing and visual impairment 5) Diet and physical activities 6) Evidence for depression or  mood disorder 7) The patient's height, weight, and BMI have been recorded in the chart     I have made referrals, and provided counseling and education based on review of the above    Hyperlipidemia LDL goal <130 Assessment & Plan: 10 yr risk remains < 5%   .  No statin therapy advixed  Lab Results  Component Value Date   CHOL 236 (H) 09/22/2022   HDL 53.80 09/22/2022   LDLCALC 161 (H) 09/22/2022   LDLDIRECT 170.0 09/22/2022   TRIG 104.0 09/22/2022   CHOLHDL 4 09/22/2022      Other orders -     Venlafaxine HCl ER; Take 1 capsule (150 mg total) by mouth daily with breakfast.  Dispense: 90 capsule; Refill: 1    No follow-ups on file.  Sherlene Shams, MD

## 2023-03-20 ENCOUNTER — Telehealth: Payer: Self-pay | Admitting: Internal Medicine

## 2023-03-20 DIAGNOSIS — Z1211 Encounter for screening for malignant neoplasm of colon: Secondary | ICD-10-CM

## 2023-03-20 NOTE — Telephone Encounter (Signed)
Patient called and would like the referral to go  [facility] [phone] (p) Address: [address]  [place] [address]

## 2023-03-20 NOTE — Telephone Encounter (Signed)
Spoke with pt and she stated that the referral is for the diagnostic colonoscopy. If okay I will place the referral.

## 2023-03-21 NOTE — Addendum Note (Signed)
Addended by: Sandy Salaam on: 03/21/2023 02:03 PM   Modules accepted: Orders

## 2023-03-21 NOTE — Telephone Encounter (Signed)
Referral has been placed. Pt is aware and gave a verbal understanding.

## 2023-04-13 ENCOUNTER — Ambulatory Visit (INDEPENDENT_AMBULATORY_CARE_PROVIDER_SITE_OTHER): Payer: Managed Care, Other (non HMO)

## 2023-04-13 DIAGNOSIS — Z23 Encounter for immunization: Secondary | ICD-10-CM | POA: Diagnosis not present

## 2023-04-13 NOTE — Progress Notes (Signed)
Patient presented for 1st shingles injection to left deltoid, patient voiced no concerns nor showed any signs of distress during injection. Pt has to return in 2-6 mnths to get 2nd  shingles vaccine

## 2023-04-18 ENCOUNTER — Ambulatory Visit
Admission: RE | Admit: 2023-04-18 | Discharge: 2023-04-18 | Disposition: A | Discharge status: Home / Self Care | Payer: Managed Care, Other (non HMO) | Source: Ambulatory Visit | Attending: Internal Medicine | Admitting: Internal Medicine

## 2023-04-18 DIAGNOSIS — Z1231 Encounter for screening mammogram for malignant neoplasm of breast: Secondary | ICD-10-CM | POA: Diagnosis present

## 2023-06-13 ENCOUNTER — Ambulatory Visit: Payer: Managed Care, Other (non HMO)

## 2023-06-13 DIAGNOSIS — Z23 Encounter for immunization: Secondary | ICD-10-CM

## 2023-06-13 NOTE — Progress Notes (Signed)
 Patient is in office today for a nurse visit for Immunization. Patient Injection was given in the  Right deltoid. Patient tolerated injection well.

## 2023-08-04 ENCOUNTER — Other Ambulatory Visit: Payer: Self-pay | Admitting: Internal Medicine

## 2023-08-04 DIAGNOSIS — H6690 Otitis media, unspecified, unspecified ear: Secondary | ICD-10-CM | POA: Insufficient documentation

## 2023-08-04 MED ORDER — PREDNISONE 10 MG PO TABS: ORAL_TABLET | ORAL | 0 refills | Status: DC

## 2023-08-04 MED ORDER — AMOXICILLIN-POT CLAVULANATE 875-125 MG PO TABS: 1.0000 | ORAL_TABLET | Freq: Two times a day (BID) | ORAL | 0 refills | Status: DC

## 2023-08-04 NOTE — Addendum Note (Signed)
 Addended by: Sherlene Shams on: 08/04/2023 03:32 PM   Modules accepted: Orders

## 2023-08-27 ENCOUNTER — Other Ambulatory Visit: Payer: Self-pay | Admitting: Internal Medicine

## 2023-08-27 DIAGNOSIS — R112 Nausea with vomiting, unspecified: Secondary | ICD-10-CM | POA: Insufficient documentation

## 2023-08-27 MED ORDER — ONDANSETRON 4 MG PO TBDP: 4.0000 mg | ORAL_TABLET | Freq: Three times a day (TID) | ORAL | 0 refills | Status: DC | PRN

## 2023-08-27 NOTE — Assessment & Plan Note (Addendum)
 Acute,  < 24 hours duration, history positive for  recent contact 2 days prior with coworker who tested positive for COVID.    Discussed symptoms with patient by phone; no fevers, RUQ pain, or diarrhea. .  Sending zofran to [facility] . Clear liquid diet until symptoms resolve

## 2023-09-19 ENCOUNTER — Telehealth: Payer: Self-pay

## 2023-09-19 ENCOUNTER — Other Ambulatory Visit: Payer: Self-pay | Admitting: Internal Medicine

## 2023-09-19 ENCOUNTER — Encounter: Payer: Self-pay | Admitting: Internal Medicine

## 2023-09-19 MED ORDER — VENLAFAXINE HCL ER 75 MG PO CP24: 75.0000 mg | ORAL_CAPSULE | Freq: Every day | ORAL | 0 refills | Status: DC

## 2023-09-19 NOTE — Telephone Encounter (Signed)
 Copied from CRM [id number]. Topic: Clinical - Medication Question >> Sep 19, 2023 11:24 AM Judy Blankenship wrote: Reason for CRM: Judy Blankenship called to speak with provider regarding her medication. Judy Blankenship is requesting to come off of the medication venlafaxine  XR (EFFEXOR -XR) 150 MG 24 hr capsule wants to know if she needs to ween herself off or just stop taking the medication . Please call Judy Blankenship at [phone]

## 2023-10-18 ENCOUNTER — Other Ambulatory Visit: Admitting: Internal Medicine

## 2023-12-04 ENCOUNTER — Telehealth: Payer: Self-pay

## 2023-12-04 NOTE — Telephone Encounter (Signed)
 I left voicemail for patient letting her know that Dr. Verneita Kettering will not be in the office on 12/12/2023 for her 2:00pm appointment, so we need to reschedule.  I asked patient to please call us  back and ask to speak with either Luke or Darice in the [facility].  E2C2 - when patient calls back, please transfer her to either Luke or Darice in our office.

## 2023-12-12 ENCOUNTER — Ambulatory Visit: Admitting: Internal Medicine

## 2024-01-02 ENCOUNTER — Encounter: Payer: Self-pay | Admitting: Internal Medicine

## 2024-01-02 ENCOUNTER — Ambulatory Visit (INDEPENDENT_AMBULATORY_CARE_PROVIDER_SITE_OTHER): Admitting: Internal Medicine

## 2024-01-02 VITALS — BP 120/82 | HR 90 | Ht 63.75 in | Wt 173.0 lb

## 2024-01-02 DIAGNOSIS — R5383 Other fatigue: Secondary | ICD-10-CM | POA: Diagnosis not present

## 2024-01-02 DIAGNOSIS — E785 Hyperlipidemia, unspecified: Secondary | ICD-10-CM | POA: Diagnosis not present

## 2024-01-02 DIAGNOSIS — R7301 Impaired fasting glucose: Secondary | ICD-10-CM

## 2024-01-02 DIAGNOSIS — D126 Benign neoplasm of colon, unspecified: Secondary | ICD-10-CM | POA: Diagnosis not present

## 2024-01-02 DIAGNOSIS — G43009 Migraine without aura, not intractable, without status migrainosus: Secondary | ICD-10-CM

## 2024-01-02 DIAGNOSIS — F419 Anxiety disorder, unspecified: Secondary | ICD-10-CM

## 2024-01-02 DIAGNOSIS — F32A Depression, unspecified: Secondary | ICD-10-CM

## 2024-01-02 MED ORDER — VENLAFAXINE HCL ER 150 MG PO CP24: 150.0000 mg | ORAL_CAPSULE | Freq: Every day | ORAL | 1 refills | Status: DC

## 2024-01-02 MED ORDER — ELETRIPTAN HYDROBROMIDE 40 MG PO TABS: 40.0000 mg | ORAL_TABLET | ORAL | 3 refills | Status: AC | PRN

## 2024-01-02 NOTE — Progress Notes (Unsigned)
 Subjective:  Patient ID: Judy Blankenship, female    DOB: 06-19-1965  Age: 58 y.o. MRN: [id number]  CC: The primary encounter diagnosis was Hyperlipidemia LDL goal <130. Diagnoses of Impaired fasting glucose, Other fatigue, Tubular adenoma of colon, Anxiety and depression, and Migraine without aura and without status migrainosus, not intractable were also pertinent to this visit.   HPI [name] presents for  Chief Complaint  Patient presents with   Medical Management of Chronic Issues   1)MDD:  patient had requested a trial without effexor  and initiated a  wean off venlafaxine  in April,  but symptoms returned.  Now taking 150- mg daily . Mood has been stable  2) migraine headahes: averages 2-3 per month    Outpatient Medications Prior to Visit  Medication Sig Dispense Refill   ondansetron  (ZOFRAN -ODT) 4 MG disintegrating tablet Take 1 tablet (4 mg total) by mouth every 8 (eight) hours as needed for nausea or vomiting. 20 tablet 0   eletriptan  (RELPAX ) 40 MG tablet TAKE 1 TABLET (40 MG TOTAL) BY MOUTH AS NEEDED. 6 tablet 3   venlafaxine  XR (EFFEXOR -XR) 75 MG 24 hr capsule TAKE 1 CAPSULE BY MOUTH DAILY WITH BREAKFAST. 90 capsule 0   No facility-administered medications prior to visit.    Review of Systems;  Patient denies  fevers, malaise, unintentional weight loss, skin rash, eye pain, sinus congestion and sinus pain, sore throat, dysphagia,  hemoptysis , cough, dyspnea, wheezing, chest pain, palpitations, orthopnea, edema, abdominal pain, nausea, melena, diarrhea, constipation, flank pain, dysuria, hematuria, urinary  Frequency, nocturia, numbness, tingling, seizures,  Focal weakness, Loss of consciousness,  Tremor, insomnia, depression, anxiety, and suicidal ideation.      Objective:  BP 120/82   Pulse 90   Ht 5' 3.75 (1.619 m)   Wt 173 lb (78.5 kg)   LMP 06/08/2017   SpO2 97%   BMI 29.93 kg/m   BP Readings from Last 3 Encounters:  01/02/24 120/82  03/14/23 120/82   09/26/22 120/76    Wt Readings from Last 3 Encounters:  01/02/24 173 lb (78.5 kg)  03/14/23 172 lb 9.6 oz (78.3 kg)  09/26/22 169 lb 12.8 oz (77 kg)    Physical Exam Vitals reviewed.  Constitutional:      General: She is not in acute distress.    Appearance: Normal appearance. She is normal weight. She is not ill-appearing, toxic-appearing or diaphoretic.  HENT:     Head: Normocephalic.  Eyes:     General: No scleral icterus.       Right eye: No discharge.        Left eye: No discharge.     Conjunctiva/sclera: Conjunctivae normal.  Cardiovascular:     Rate and Rhythm: Normal rate and regular rhythm.     Heart sounds: Normal heart sounds.  Pulmonary:     Effort: Pulmonary effort is normal. No respiratory distress.     Breath sounds: Normal breath sounds.  Musculoskeletal:        General: Normal range of motion.  Skin:    General: Skin is warm and dry.  Neurological:     General: No focal deficit present.     Mental Status: She is alert and oriented to person, place, and time. Mental status is at baseline.  Psychiatric:        Mood and Affect: Mood normal.        Behavior: Behavior normal.        Thought Content: Thought content normal.  Judgment: Judgment normal.     Lab Results  Component Value Date   HGBA1C 5.7 09/22/2022   HGBA1C 5.8 02/15/2022    Lab Results  Component Value Date   CREATININE 0.71 09/22/2022   CREATININE 0.76 02/15/2022   CREATININE 0.66 01/26/2021    Lab Results  Component Value Date   WBC 5.7 02/15/2022   HGB 12.9 02/15/2022   HCT 37.9 02/15/2022   PLT 298.0 02/15/2022   GLUCOSE 97 09/22/2022   CHOL 236 (H) 09/22/2022   TRIG 104.0 09/22/2022   HDL 53.80 09/22/2022   LDLDIRECT 170.0 09/22/2022   LDLCALC 161 (H) 09/22/2022   ALT 17 09/22/2022   AST 16 09/22/2022   NA 140 09/22/2022   K 4.0 09/22/2022   CL 102 09/22/2022   CREATININE 0.71 09/22/2022   BUN 15 09/22/2022   CO2 28 09/22/2022   TSH 1.78 02/15/2022    HGBA1C 5.7 09/22/2022    MM 3D SCREENING MAMMOGRAM BILATERAL BREAST Result Date: 04/20/2023 CLINICAL DATA:  Screening. EXAM: DIGITAL SCREENING BILATERAL MAMMOGRAM WITH TOMOSYNTHESIS AND CAD TECHNIQUE: Bilateral screening digital craniocaudal and mediolateral oblique mammograms were obtained. Bilateral screening digital breast tomosynthesis was performed. The images were evaluated with computer-aided detection. COMPARISON:  Previous exam(s). ACR Breast Density Category c: The breasts are heterogeneously dense, which may obscure small masses. FINDINGS: There are no findings suspicious for malignancy. IMPRESSION: No mammographic evidence of malignancy. A result letter of this screening mammogram will be mailed directly to the patient. RECOMMENDATION: Screening mammogram in one year. (Code:SM-B-01Y) BI-RADS CATEGORY  1: Negative. Electronically Signed   By: Dirk Arrant M.D.   On: 04/20/2023 10:16    Assessment & Plan:  .Hyperlipidemia LDL goal <130 -     Lipid panel; Future -     LDL cholesterol, direct; Future  Impaired fasting glucose -     Comprehensive metabolic panel with GFR; Future -     Hemoglobin A1c; Future  Other fatigue -     TSH; Future -     CBC with Differential/Platelet; Future  Tubular adenoma of colon  Anxiety and depression Assessment & Plan: Improved with change to  effexor  due to decreased libido. Recent attempt to wean of medication resulted  in increased symptoms of irritability and anxiety.  She has resumed 150 mg daily with improvement reported in mood. No changes today    Migraine without aura and without status migrainosus, not intractable Assessment & Plan: Occurring at most 3/month with no loss of productivity.  Continue prn relpax    Other orders -     Eletriptan  Hydrobromide; Take 1 tablet (40 mg total) by mouth as needed.  Dispense: 6 tablet; Refill: 3 -     Venlafaxine  HCl ER; Take 1 capsule (150 mg total) by mouth daily with breakfast.  Dispense:  90 capsule; Refill: 1   Follow-up: No follow-ups on file.   Verneita LITTIE Kettering, MD

## 2024-01-04 NOTE — Assessment & Plan Note (Signed)
 Occurring at most 3/month with no loss of productivity.  Continue prn relpax 

## 2024-01-04 NOTE — Assessment & Plan Note (Signed)
 Improved with change to  effexor  due to decreased libido. Recent attempt to wean of medication resulted  in increased symptoms of irritability and anxiety.  She has resumed 150 mg daily with improvement reported in mood. No changes today

## 2024-01-17 ENCOUNTER — Other Ambulatory Visit: Payer: Self-pay | Admitting: Internal Medicine

## 2024-04-09 ENCOUNTER — Other Ambulatory Visit

## 2024-04-14 ENCOUNTER — Ambulatory Visit: Admitting: Internal Medicine

## 2024-04-14 ENCOUNTER — Other Ambulatory Visit (HOSPITAL_COMMUNITY)
Admission: RE | Admit: 2024-04-14 | Discharge: 2024-04-14 | Disposition: A | Discharge status: Home / Self Care | Source: Ambulatory Visit | Attending: Internal Medicine | Admitting: Internal Medicine

## 2024-04-14 ENCOUNTER — Encounter: Payer: Self-pay | Admitting: Internal Medicine

## 2024-04-14 VITALS — BP 110/74 | HR 86 | Ht 63.75 in | Wt 172.8 lb

## 2024-04-14 DIAGNOSIS — Z124 Encounter for screening for malignant neoplasm of cervix: Secondary | ICD-10-CM | POA: Diagnosis present

## 2024-04-14 DIAGNOSIS — E785 Hyperlipidemia, unspecified: Secondary | ICD-10-CM | POA: Diagnosis not present

## 2024-04-14 DIAGNOSIS — R5383 Other fatigue: Secondary | ICD-10-CM

## 2024-04-14 DIAGNOSIS — Z1231 Encounter for screening mammogram for malignant neoplasm of breast: Secondary | ICD-10-CM | POA: Diagnosis not present

## 2024-04-14 DIAGNOSIS — Z0000 Encounter for general adult medical examination without abnormal findings: Secondary | ICD-10-CM | POA: Diagnosis not present

## 2024-04-14 DIAGNOSIS — R7301 Impaired fasting glucose: Secondary | ICD-10-CM | POA: Diagnosis not present

## 2024-04-14 DIAGNOSIS — Z1211 Encounter for screening for malignant neoplasm of colon: Secondary | ICD-10-CM

## 2024-04-14 LAB — TSH: TSH: 1.05 u[IU]/mL (ref 0.35–5.50)

## 2024-04-14 LAB — COMPREHENSIVE METABOLIC PANEL WITH GFR
ALT: 19 U/L (ref 0–35)
AST: 16 U/L (ref 0–37)
Albumin: 4.3 g/dL (ref 3.5–5.2)
Alkaline Phosphatase: 75 U/L (ref 39–117)
BUN: 14 mg/dL (ref 6–23)
CO2: 27 meq/L (ref 19–32)
Calcium: 9.2 mg/dL (ref 8.4–10.5)
Chloride: 102 meq/L (ref 96–112)
Creatinine, Ser: 0.73 mg/dL (ref 0.40–1.20)
GFR: 89.97 mL/min (ref >60.00)
Glucose, Bld: 84 mg/dL (ref 70–99)
Potassium: 4.3 meq/L (ref 3.5–5.1)
Sodium: 139 meq/L (ref 135–145)
Total Bilirubin: 0.4 mg/dL (ref 0.2–1.2)
Total Protein: 6.9 g/dL (ref 6.0–8.3)

## 2024-04-14 LAB — CBC WITH DIFFERENTIAL/PLATELET
Basophils Absolute: 0.1 K/uL (ref 0.0–0.1)
Basophils Relative: 1 % (ref 0.0–3.0)
Eosinophils Absolute: 0.3 K/uL (ref 0.0–0.7)
Eosinophils Relative: 4.5 % (ref 0.0–5.0)
HCT: 39.1 % (ref 36.0–46.0)
Hemoglobin: 12.9 g/dL (ref 12.0–15.0)
Lymphocytes Relative: 35.9 % (ref 12.0–46.0)
Lymphs Abs: 2.2 K/uL (ref 0.7–4.0)
MCHC: 33 g/dL (ref 30.0–36.0)
MCV: 89.5 fl (ref 78.0–100.0)
Monocytes Absolute: 0.5 K/uL (ref 0.1–1.0)
Monocytes Relative: 8.8 % (ref 3.0–12.0)
Neutro Abs: 3 K/uL (ref 1.4–7.7)
Neutrophils Relative %: 49.8 % (ref 43.0–77.0)
Platelets: 319 K/uL (ref 150.0–400.0)
RBC: 4.37 Mil/uL (ref 3.87–5.11)
RDW: 13.1 % (ref 11.5–15.5)
WBC: 6.1 K/uL (ref 4.0–10.5)

## 2024-04-14 LAB — LIPID PANEL
Cholesterol: 232 mg/dL — ABNORMAL HIGH (ref 0–200)
HDL: 49.4 mg/dL (ref >39.00)
LDL Cholesterol: 157 mg/dL — ABNORMAL HIGH (ref 0–99)
NonHDL: 182.38
Total CHOL/HDL Ratio: 5
Triglycerides: 129 mg/dL (ref 0.0–149.0)
VLDL: 25.8 mg/dL (ref 0.0–40.0)

## 2024-04-14 LAB — LDL CHOLESTEROL, DIRECT: Direct LDL: 170 mg/dL

## 2024-04-14 MED ORDER — VENLAFAXINE HCL ER 150 MG PO CP24: 150.0000 mg | ORAL_CAPSULE | Freq: Every day | ORAL | 3 refills | Status: AC

## 2024-04-14 NOTE — Progress Notes (Unsigned)
 Patient ID: Judy Blankenship, female    DOB: [birth year]  Age: 58 y.o. MRN: [id number]  The patient is here for annual preventive examination and management of other chronic and acute problems.   The risk factors are reflected in the social history.   The roster of all physicians providing medical care to patient - is listed in the Snapshot section of the chart.   Activities of daily living:  The patient is 100% independent in all ADLs: dressing, toileting, feeding as well as independent mobility   Home safety : The patient has smoke detectors in the home. They wear seatbelts.  There are no unsecured firearms at home. There is no violence in the home.    There is no risks for hepatitis, STDs or HIV. There is no   history of blood transfusion. They have no travel history to infectious disease endemic areas of the world.   The patient has seen their dentist in the last six month. They have seen their eye doctor in the last year. The patinet  denies slight hearing difficulty with regard to whispered voices and some television programs.  They have deferred audiologic testing in the last year.  They do not  have excessive sun exposure. Discussed the need for sun protection: hats, long sleeves and use of sunscreen if there is significant sun exposure.    Diet: the importance of a healthy diet is discussed. They do have a healthy diet.   The benefits of regular aerobic exercise were discussed. The patient  exercises  3 to 5 days per week  for  60 minutes.    Depression screen: there are no signs or vegative symptoms of depression- irritability, change in appetite, anhedonia, sadness/tearfullness.   The following portions of the patient's history were reviewed and updated as appropriate: allergies, current medications, past family history, past medical history,  past surgical history, past social history  and problem list.   Visual acuity was not assessed per patient preference since the patient has  regular follow up with an  ophthalmologist. Hearing and body mass index were assessed and reviewed.    During the course of the visit the patient was educated and counseled about appropriate screening and preventive services including : fall prevention , diabetes screening, nutrition counseling, colorectal cancer screening, and recommended immunizations.    Chief Complaint:  HPI     Annual Exam    Additional comments: Physical with a pap smear      Last edited by Harriette Raisin, CMA on 04/14/2024  9:45 AM.        Review of Symptoms  Patient denies headache, fevers, malaise, unintentional weight loss, skin rash, eye pain, sinus congestion and sinus pain, sore throat, dysphagia,  hemoptysis , cough, dyspnea, wheezing, chest pain, palpitations, orthopnea, edema, abdominal pain, nausea, melena, diarrhea, constipation, flank pain, dysuria, hematuria, urinary  Frequency, nocturia, numbness, tingling, seizures,  Focal weakness, Loss of consciousness,  Tremor, insomnia, depression, anxiety, and suicidal ideation.    Physical Exam:  BP 110/74   Pulse 86   Ht 5' 3.75 (1.619 m)   Wt 172 lb 12.8 oz (78.4 kg)   LMP 06/08/2017   SpO2 96%   BMI 29.89 kg/m    Physical Exam Vitals reviewed.  Constitutional:      General: She is not in acute distress.    Appearance: Normal appearance. She is well-developed and normal weight. She is not ill-appearing, toxic-appearing or diaphoretic.  HENT:     Head: Normocephalic.  Right Ear: Tympanic membrane, ear canal and external ear normal. There is no impacted cerumen.     Left Ear: Tympanic membrane, ear canal and external ear normal. There is no impacted cerumen.     Nose: Nose normal.     Mouth/Throat:     Mouth: Mucous membranes are moist.     Pharynx: Oropharynx is clear.  Eyes:     General: No scleral icterus.       Right eye: No discharge.        Left eye: No discharge.     Conjunctiva/sclera: Conjunctivae normal.     Pupils:  Pupils are equal, round, and reactive to light.  Neck:     Thyroid: No thyromegaly.     Vascular: No carotid bruit or JVD.  Cardiovascular:     Rate and Rhythm: Normal rate and regular rhythm.     Heart sounds: Normal heart sounds.  Pulmonary:     Effort: Pulmonary effort is normal. No respiratory distress.     Breath sounds: Normal breath sounds.  Chest:  Breasts:    Breasts are symmetrical.     Right: Normal. No swelling, inverted nipple, mass, nipple discharge, skin change or tenderness.     Left: Normal. No swelling, inverted nipple, mass, nipple discharge, skin change or tenderness.  Abdominal:     General: Bowel sounds are normal.     Palpations: Abdomen is soft. There is no mass.     Tenderness: There is no abdominal tenderness. There is no guarding or rebound.     Hernia: There is no hernia in the left inguinal area or right inguinal area.  Genitourinary:    Exam position: Lithotomy position.     Pubic Area: No rash or pubic lice.      Labia:        Right: No rash, tenderness, lesion or injury.        Left: No rash, tenderness, lesion or injury.      Vagina: Normal.     Cervix: Normal.     Uterus: Normal.      Adnexa: Right adnexa normal and left adnexa normal.  Musculoskeletal:        General: Normal range of motion.     Cervical back: Normal range of motion and neck supple.  Lymphadenopathy:     Cervical: No cervical adenopathy.     Upper Body:     Right upper body: No supraclavicular, axillary or pectoral adenopathy.     Left upper body: No supraclavicular, axillary or pectoral adenopathy.     Lower Body: No right inguinal adenopathy. No left inguinal adenopathy.  Skin:    General: Skin is warm and dry.  Neurological:     General: No focal deficit present.     Mental Status: She is alert and oriented to person, place, and time. Mental status is at baseline.  Psychiatric:        Mood and Affect: Mood normal.        Behavior: Behavior normal.        Thought  Content: Thought content normal.        Judgment: Judgment normal.    Assessment and Plan: Cervical cancer screening  Encounter for preventive health examination  Colon cancer screening  Encounter for screening mammogram for malignant neoplasm of breast    No follow-ups on file.  Judy LITTIE Kettering, MD

## 2024-04-14 NOTE — Patient Instructions (Signed)
 YOUR MAMMOGRAM has been ordered , PLEASE CALL [facility] TO  GET THIS SCHEDULED [facility] - call [phone]  [facility] does  the scheduling for [facility] as well      REFERRAL FOR COLONOSCOPY HAS ALSO BEEN MADE TO [facility]

## 2024-04-15 LAB — HEMOGLOBIN A1C: Hgb A1c MFr Bld: 5.8 % (ref 4.6–6.5)

## 2024-04-15 NOTE — Assessment & Plan Note (Signed)

## 2024-04-16 LAB — CYTOLOGY - PAP
Adequacy: ABSENT
Comment: NEGATIVE
Diagnosis: NEGATIVE
High risk HPV: NEGATIVE

## 2024-04-17 ENCOUNTER — Ambulatory Visit: Payer: Self-pay | Admitting: Internal Medicine

## 2024-05-30 ENCOUNTER — Ambulatory Visit
Admission: RE | Admit: 2024-05-30 | Discharge: 2024-05-30 | Disposition: A | Discharge status: Home / Self Care | Source: Ambulatory Visit | Attending: Internal Medicine | Admitting: Internal Medicine

## 2024-05-30 DIAGNOSIS — Z1231 Encounter for screening mammogram for malignant neoplasm of breast: Secondary | ICD-10-CM | POA: Insufficient documentation
